# Patient Record
Sex: Male | Born: 1935 | Race: White | Hispanic: No | Smoking: Never smoker
Health system: Southern US, Community
[De-identification: ages and names within clinical notes are randomized; demographics above are authoritative.]

## PROBLEM LIST (undated history)

## (undated) DIAGNOSIS — I10 Essential (primary) hypertension: Secondary | ICD-10-CM

## (undated) DIAGNOSIS — F0390 Unspecified dementia without behavioral disturbance: Secondary | ICD-10-CM

## (undated) HISTORY — PX: HERNIA REPAIR: SHX51

---

## 2016-07-25 ENCOUNTER — Emergency Department (HOSPITAL_COMMUNITY): Payer: Medicare Other

## 2016-07-25 ENCOUNTER — Emergency Department (HOSPITAL_COMMUNITY)
Admission: EM | Admit: 2016-07-25 | Discharge: 2016-07-25 | Disposition: A | Discharge status: Home / Self Care | Payer: Medicare Other | Attending: Emergency Medicine | Admitting: Emergency Medicine

## 2016-07-25 DIAGNOSIS — W01198A Fall on same level from slipping, tripping and stumbling with subsequent striking against other object, initial encounter: Secondary | ICD-10-CM | POA: Insufficient documentation

## 2016-07-25 DIAGNOSIS — Y999 Unspecified external cause status: Secondary | ICD-10-CM | POA: Insufficient documentation

## 2016-07-25 DIAGNOSIS — S0990XA Unspecified injury of head, initial encounter: Secondary | ICD-10-CM | POA: Diagnosis present

## 2016-07-25 DIAGNOSIS — S0081XA Abrasion of other part of head, initial encounter: Secondary | ICD-10-CM

## 2016-07-25 DIAGNOSIS — S0181XA Laceration without foreign body of other part of head, initial encounter: Secondary | ICD-10-CM | POA: Diagnosis not present

## 2016-07-25 DIAGNOSIS — S72114A Nondisplaced fracture of greater trochanter of right femur, initial encounter for closed fracture: Secondary | ICD-10-CM | POA: Insufficient documentation

## 2016-07-25 DIAGNOSIS — W19XXXA Unspecified fall, initial encounter: Secondary | ICD-10-CM

## 2016-07-25 DIAGNOSIS — Y929 Unspecified place or not applicable: Secondary | ICD-10-CM | POA: Diagnosis not present

## 2016-07-25 DIAGNOSIS — Y9301 Activity, walking, marching and hiking: Secondary | ICD-10-CM | POA: Insufficient documentation

## 2016-07-25 MED ORDER — HYDROCODONE-ACETAMINOPHEN 5-325 MG PO TABS
1.0000 | ORAL_TABLET | Freq: Once | ORAL | Status: AC
  Administered 2016-07-25: 1 via ORAL
  Filled 2016-07-25: qty 1

## 2016-07-25 MED ORDER — HYDROCODONE-ACETAMINOPHEN 5-325 MG PO TABS: 1.0000 | ORAL_TABLET | Freq: Four times a day (QID) | ORAL | 0 refills | Status: DC | PRN

## 2016-07-25 NOTE — ED Triage Notes (Signed)
Patient's family member states patient fell this morning hitting his head on the right temple and is also complaining of right hip pain at triage. Denies LOC.

## 2016-07-25 NOTE — ED Provider Notes (Signed)
[facility] Provider Note   CSN: [id number] Arrival date & time: 07/25/16  1133  By signing my name below, I, Sonum Patel, attest that this documentation has been prepared under the direction and in the presence of Geoffery Lyonsouglas Khushboo Chuck, MD. Electronically Signed: Sonum Patel, Neurosurgeoncribe. 07/25/16. 1:08 PM.  History   Chief Complaint Chief Complaint  Patient presents with  . Fall   Majority of history provided by daughter who is at bedside The history is provided by a relative and the patient. No language interpreter was used.  Fall  This is a new problem. The current episode started 1 to 2 hours ago.    HPI Comments: Jonathan Valencia is a 83 y.o. male who presents to the Emergency Department complaining of an unwitnessed fall that occurred PTA. Patient's daughter states he was walking with clothes in his hands when he tripped and fell, landing on his right hip and struck his right temple. She denies LOC. She states he typically walks with a cane but had to be put in a wheelchair this morning. She attempted to take him to an adult care facility but the nurse there suggested he be evaluated in the ED due to his inability to walk and increased incoherence. Patient denies neck pain, numbness, hip pain, back pain, or any other symptoms at this time. Daughter denies anti-coagulant use.    No past medical history on file.  There are no active problems to display for this patient.   No past surgical history on file.     Home Medications    Prior to Admission medications   Not on File    Family History No family history on file.  Social History Social History  Substance Use Topics  . Smoking status: Not on file  . Smokeless tobacco: Not on file  . Alcohol use Not on file     Allergies   Patient has no allergy information on record.   Review of Systems Review of Systems  A complete 10 system review of systems was obtained and all systems are negative except as noted in the  HPI and PMH.    Physical Exam Updated Vital Signs BP 151/84 (BP Location: Left Arm)   Pulse (!) 56   Temp 97.6 F (36.4 C) (Oral)   Resp 18   Ht 5' (1.524 m)   Wt 135 lb (61.2 kg)   SpO2 100%   BMI 26.37 kg/m   Physical Exam  Constitutional: He appears well-developed and well-nourished.  HENT:  Head: Normocephalic.  Mild bruising to the right temple and a small 1 cm, round, skin tear.   Eyes: EOM are normal. Pupils are equal, round, and reactive to light.  Neck: Normal range of motion.  Cardiovascular: Normal rate, regular rhythm, normal heart sounds and intact distal pulses.   Pulmonary/Chest: Effort normal and breath sounds normal. No respiratory distress.  Abdominal: Soft. He exhibits no distension. There is no tenderness.  Musculoskeletal: Normal range of motion. He exhibits tenderness.  Mild tenderness over the lateral right hip. Full ROM with minimal discomfort. Distal extremity PMS intact. Pelvis stable.   Neurological: He is alert. No cranial nerve deficit. He exhibits normal muscle tone. Coordination normal.  Skin: Skin is warm and dry.  Psychiatric: He has a normal mood and affect. Judgment normal.  Nursing note and vitals reviewed.    ED Treatments / Results  DIAGNOSTIC STUDIES: Oxygen Saturation is 100% on RA, normal by my interpretation.    COORDINATION OF CARE: 12:32  PM Discussed treatment plan with pt at bedside and pt agreed to plan.    Labs (all labs ordered are listed, but only abnormal results are displayed) Labs Reviewed - No data to display  EKG  EKG Interpretation None       Radiology No results found.  Procedures Procedures (including critical care time)  Medications Ordered in ED Medications - No data to display   Initial Impression / Assessment and Plan / ED Course  I have reviewed the triage vital signs and the nursing notes.  Pertinent labs & imaging results that were available during my care of the patient were reviewed by  me and considered in my medical decision making (see chart for details).  Clinical Course     Patient presents after a fall at home. He has dementia and his history is somewhat limited. Most of the information was provided through the daughter who is present and at bedside. He has abrasions to the right temple which are not in need of repair. I will recommend bacitracin and local wound care. CT is negative, however x-ray of the right hip does reveal a fracture of the greater trochanter. I discussed these findings with Dr. Magnus Ivan from orthopedic surgery. He does not feel as though this warrants any surgical information. He will be treated as weightbearing as tolerated and given pain medication. To return as needed for any problems.  Final Clinical Impressions(s) / ED Diagnoses   Final diagnoses:  None    New Prescriptions New Prescriptions   No medications on file   I personally performed the services described in this documentation, which was scribed in my presence. The recorded information has been reviewed and is accurate.        Geoffery Lyons, MD 07/25/16 [phone]

## 2016-07-25 NOTE — Discharge Instructions (Signed)
Local wound care with bacitracin and dressing changes twice daily.  Weightbearing as tolerated to the right hip.  Hydrocodone as prescribed as needed for pain.  Follow-up with your primary Dr. in the next week, and return to the ER for worsening pain, increased confusion, severe headache, or other new and concerning symptoms.

## 2016-07-25 NOTE — ED Notes (Signed)
Family denies pt uses bld thinner.

## 2016-08-01 ENCOUNTER — Emergency Department (HOSPITAL_COMMUNITY): Payer: Medicare Other

## 2016-08-01 ENCOUNTER — Emergency Department (HOSPITAL_COMMUNITY)
Admission: EM | Admit: 2016-08-01 | Discharge: 2016-08-01 | Disposition: A | Discharge status: Home / Self Care | Payer: Medicare Other | Attending: Emergency Medicine | Admitting: Emergency Medicine

## 2016-08-01 ENCOUNTER — Encounter (HOSPITAL_COMMUNITY): Payer: Self-pay | Admitting: Emergency Medicine

## 2016-08-01 DIAGNOSIS — W19XXXA Unspecified fall, initial encounter: Secondary | ICD-10-CM

## 2016-08-01 DIAGNOSIS — S79911D Unspecified injury of right hip, subsequent encounter: Secondary | ICD-10-CM | POA: Diagnosis present

## 2016-08-01 DIAGNOSIS — S0990XD Unspecified injury of head, subsequent encounter: Secondary | ICD-10-CM | POA: Diagnosis not present

## 2016-08-01 DIAGNOSIS — W1839XD Other fall on same level, subsequent encounter: Secondary | ICD-10-CM | POA: Insufficient documentation

## 2016-08-01 DIAGNOSIS — S72114D Nondisplaced fracture of greater trochanter of right femur, subsequent encounter for closed fracture with routine healing: Secondary | ICD-10-CM | POA: Insufficient documentation

## 2016-08-01 DIAGNOSIS — Z7982 Long term (current) use of aspirin: Secondary | ICD-10-CM | POA: Diagnosis not present

## 2016-08-01 DIAGNOSIS — Z79899 Other long term (current) drug therapy: Secondary | ICD-10-CM | POA: Insufficient documentation

## 2016-08-01 DIAGNOSIS — S72001D Fracture of unspecified part of neck of right femur, subsequent encounter for closed fracture with routine healing: Secondary | ICD-10-CM

## 2016-08-01 DIAGNOSIS — I10 Essential (primary) hypertension: Secondary | ICD-10-CM | POA: Insufficient documentation

## 2016-08-01 DIAGNOSIS — M25551 Pain in right hip: Secondary | ICD-10-CM

## 2016-08-01 HISTORY — DX: Essential (primary) hypertension: I10

## 2016-08-01 HISTORY — DX: Unspecified dementia, unspecified severity, without behavioral disturbance, psychotic disturbance, mood disturbance, and anxiety: F03.90

## 2016-08-01 LAB — COMPREHENSIVE METABOLIC PANEL
ALT: 20 U/L (ref 17–63)
ANION GAP: 6 (ref 5–15)
AST: 35 U/L (ref 15–41)
Albumin: 3.4 g/dL — ABNORMAL LOW (ref 3.5–5.0)
Alkaline Phosphatase: 73 U/L (ref 38–126)
BUN: 14 mg/dL (ref 6–20)
CHLORIDE: 102 mmol/L (ref 101–111)
CO2: 30 mmol/L (ref 22–32)
Calcium: 9.2 mg/dL (ref 8.9–10.3)
Creatinine, Ser: 0.73 mg/dL (ref 0.61–1.24)
Glucose, Bld: 99 mg/dL (ref 65–99)
POTASSIUM: 4 mmol/L (ref 3.5–5.1)
SODIUM: 138 mmol/L (ref 135–145)
Total Bilirubin: 2.3 mg/dL — ABNORMAL HIGH (ref 0.3–1.2)
Total Protein: 6.3 g/dL — ABNORMAL LOW (ref 6.5–8.1)

## 2016-08-01 LAB — URINALYSIS, ROUTINE W REFLEX MICROSCOPIC
BILIRUBIN URINE: NEGATIVE
Glucose, UA: NEGATIVE mg/dL
Hgb urine dipstick: NEGATIVE
Ketones, ur: NEGATIVE mg/dL
LEUKOCYTES UA: NEGATIVE
NITRITE: NEGATIVE
Protein, ur: NEGATIVE mg/dL
SPECIFIC GRAVITY, URINE: 1.015 (ref 1.005–1.030)
pH: 6 (ref 5.0–8.0)

## 2016-08-01 LAB — CBC WITH DIFFERENTIAL/PLATELET
Basophils Absolute: 0 10*3/uL (ref 0.0–0.1)
Basophils Relative: 0 %
EOS ABS: 0 10*3/uL (ref 0.0–0.7)
EOS PCT: 0 %
HCT: 39.9 % (ref 39.0–52.0)
Hemoglobin: 13.6 g/dL (ref 13.0–17.0)
LYMPHS ABS: 1.4 10*3/uL (ref 0.7–4.0)
LYMPHS PCT: 13 %
MCH: 30.4 pg (ref 26.0–34.0)
MCHC: 34.1 g/dL (ref 30.0–36.0)
MCV: 89.3 fL (ref 78.0–100.0)
Monocytes Absolute: 0.9 10*3/uL (ref 0.1–1.0)
Monocytes Relative: 8 %
Neutro Abs: 8.9 10*3/uL — ABNORMAL HIGH (ref 1.7–7.7)
Neutrophils Relative %: 79 %
PLATELETS: 307 10*3/uL (ref 150–400)
RBC: 4.47 MIL/uL (ref 4.22–5.81)
RDW: 13.9 % (ref 11.5–15.5)
WBC: 11.3 10*3/uL — ABNORMAL HIGH (ref 4.0–10.5)

## 2016-08-01 NOTE — ED Notes (Signed)
2 person moderate assist to transfer from bed to w/c and w/c to car

## 2016-08-01 NOTE — ED Provider Notes (Signed)
[facility] Provider Note   CSN: [id number] Arrival date & time: 08/01/16  2130  By signing my name below, I, Freida Busman, attest that this documentation has been prepared under the direction and in the presence of Lavera Guise, MD . Electronically Signed: Freida Busman, Scribe. 08/01/2016. 9:27 AM.  History   Chief Complaint Chief Complaint  Patient presents with  . Fall    The history is provided by the patient and a relative. No language interpreter was used.    HPI Comments:  Jonathan Valencia is a 83 y.o. male who presents to the Emergency Department s/p fall x 2 last night. He is complaining of moderate pain to his right hip only with movement. Pt denies pain when at rest. Both falls were unwitnessed but daughter speculates he injured his right elbow and face as she noticed bruising. Daughter also believes he struck his hip on a table during the second fall.  Pt denies LOC. He states he landed on his right side. Pt was diagnosed with a fracture of the right greater trochanter on 07/25/16 following another fall. He had not been ambulatory since but yesterday he was attempting to walk when he fell.  He also denies SOB, cough, vomiting, and diarrhea  Pt lives at home with his daughter.    Past Medical History:  Diagnosis Date  . Dementia   . Hypertension     There are no active problems to display for this patient.   Past Surgical History:  Procedure Laterality Date  . HERNIA REPAIR       Home Medications    Prior to Admission medications   Medication Sig Start Date End Date Taking? Authorizing Provider  aspirin EC 81 MG tablet Take 81 mg by mouth daily.   Yes Historical Provider, MD  divalproex (DEPAKOTE SPRINKLE) 125 MG capsule Take 125 mg by mouth daily. Take at 4 pm.   Yes Historical Provider, MD  donepezil (ARICEPT) 10 MG tablet Take 10 mg by mouth daily.   Yes Historical Provider, MD  HYDROcodone-acetaminophen (NORCO) 5-325 MG tablet Take 1-2 tablets by  mouth every 6 (six) hours as needed. Patient taking differently: Take 1-2 tablets by mouth every 6 (six) hours as needed for moderate pain.  07/25/16  Yes Geoffery Lyons, MD  quinapril (ACCUPRIL) 40 MG tablet Take 40 mg by mouth daily.   Yes Historical Provider, MD  traZODone (DESYREL) 50 MG tablet Take 25 mg by mouth at bedtime.   Yes Historical Provider, MD    Family History History reviewed. No pertinent family history.  Social History Social History  Substance Use Topics  . Smoking status: Never Smoker  . Smokeless tobacco: Never Used  . Alcohol use No     Allergies   Patient has no known allergies.   Review of Systems Review of Systems  10 systems reviewed and all are negative for acute change except as noted in the HPI.   Physical Exam Updated Vital Signs BP 150/97 (BP Location: Left Arm)   Pulse 64   Temp 97.6 F (36.4 C) (Oral)   Resp 16   Ht 5' (1.524 m)   Wt 135 lb (61.2 kg)   SpO2 100%   BMI 26.37 kg/m   Physical Exam Physical Exam  Nursing note and vitals reviewed. Constitutional: Well developed, well nourished, non-toxic, and in no acute distress Head: Normocephalic and atraumatic.  Mouth/Throat: Oropharynx is clear and moist.  Neck: Normal range of motion. Neck supple.  Cardiovascular: Normal rate  and regular rhythm.   Pulmonary/Chest: Effort normal and breath sounds normal.  Abdominal: Soft. There is no tenderness. There is no rebound and no guarding.  Musculoskeletal: Normal range of motion of all 4 extremities. No CTLS spine tenderness. No chest wall tenderness  Neurological: Alert, no facial droop, fluent speech, moves all extremities symmetrically Skin: Skin is warm and dry.  Psychiatric: Cooperative   ED Treatments / Results  DIAGNOSTIC STUDIES:  Oxygen Saturation is 100% on RA, normal by my interpretation.    COORDINATION OF CARE:  9:24 AM Discussed treatment plan with pt and family at bedside and they agreed to plan.  Labs (all labs  ordered are listed, but only abnormal results are displayed) Labs Reviewed  CBC WITH DIFFERENTIAL/PLATELET - Abnormal; Notable for the following:       Result Value   WBC 11.3 (*)    Neutro Abs 8.9 (*)    All other components within normal limits  COMPREHENSIVE METABOLIC PANEL - Abnormal; Notable for the following:    Total Protein 6.3 (*)    Albumin 3.4 (*)    Total Bilirubin 2.3 (*)    All other components within normal limits  URINALYSIS, ROUTINE W REFLEX MICROSCOPIC - Abnormal; Notable for the following:    APPearance HAZY (*)    All other components within normal limits    EKG  EKG Interpretation None       Radiology Dg Shoulder Right  Result Date: 08/01/2016 CLINICAL DATA:  Right hip and right shoulder pain.  Fall. EXAM: RIGHT SHOULDER - 2+ VIEW COMPARISON:  No recent prior . FINDINGS: Acromioclavicular glenohumeral degenerative change. No evidence of fracture dislocation. Diffuse osteopenia . IMPRESSION: Diffuse osteopenia degenerative change. No acute abnormality identified. Electronically Signed   By: Maisie Fus  Register   On: 08/01/2016 10:04   Ct Head Wo Contrast  Result Date: 08/01/2016 CLINICAL DATA:  The patient fell twice last night.  Head trauma. EXAM: CT HEAD WITHOUT CONTRAST CT CERVICAL SPINE WITHOUT CONTRAST TECHNIQUE: Multidetector CT imaging of the head and cervical spine was performed following the standard protocol without intravenous contrast. Multiplanar CT image reconstructions of the cervical spine were also generated. COMPARISON:  None. CT scan dated 07/25/2016 FINDINGS: CT HEAD FINDINGS Brain: No evidence of acute infarction, hemorrhage, hydrocephalus, extra-axial collection or mass lesion/mass effect. Diffuse slight atrophy with secondary ventricular dilatation. Vascular: No hyperdense vessel or unexpected calcification. Skull: Normal. Negative for fracture or focal lesion. Sinuses/Orbits: Normal. CT CERVICAL SPINE FINDINGS Alignment: Slight anterolisthesis  at C3-4 and C7-T1 due to facet arthritis at each of those levels. Skull base and vertebrae: No acute fracture. Soft tissues and spinal canal: No prevertebral fluid or swelling. No visible canal hematoma. Disc levels: Disc space narrowing at C5-6 and C6-7 without focal disc protrusion. Severe bilateral facet arthritis at C3-4. Auto fusion of the left facet joint at C5-6. Severe degenerative changes between the odontoid in the anterior arch of C1. Upper chest: Negative. Other: None IMPRESSION: 1. No acute intracranial abnormality.  Diffuse mild atrophy. 2. No acute abnormality of the cervical spine. Multilevel degenerative disc and joint disease as described. Electronically Signed   By: Francene Boyers M.D.   On: 08/01/2016 10:40   Ct Cervical Spine Wo Contrast  Result Date: 08/01/2016 CLINICAL DATA:  The patient fell twice last night.  Head trauma. EXAM: CT HEAD WITHOUT CONTRAST CT CERVICAL SPINE WITHOUT CONTRAST TECHNIQUE: Multidetector CT imaging of the head and cervical spine was performed following the standard protocol without intravenous contrast. Multiplanar  CT image reconstructions of the cervical spine were also generated. COMPARISON:  None. CT scan dated 07/25/2016 FINDINGS: CT HEAD FINDINGS Brain: No evidence of acute infarction, hemorrhage, hydrocephalus, extra-axial collection or mass lesion/mass effect. Diffuse slight atrophy with secondary ventricular dilatation. Vascular: No hyperdense vessel or unexpected calcification. Skull: Normal. Negative for fracture or focal lesion. Sinuses/Orbits: Normal. CT CERVICAL SPINE FINDINGS Alignment: Slight anterolisthesis at C3-4 and C7-T1 due to facet arthritis at each of those levels. Skull base and vertebrae: No acute fracture. Soft tissues and spinal canal: No prevertebral fluid or swelling. No visible canal hematoma. Disc levels: Disc space narrowing at C5-6 and C6-7 without focal disc protrusion. Severe bilateral facet arthritis at C3-4. Auto fusion of the  left facet joint at C5-6. Severe degenerative changes between the odontoid in the anterior arch of C1. Upper chest: Negative. Other: None IMPRESSION: 1. No acute intracranial abnormality.  Diffuse mild atrophy. 2. No acute abnormality of the cervical spine. Multilevel degenerative disc and joint disease as described. Electronically Signed   By: Francene BoyersJames  Maxwell M.D.   On: 08/01/2016 10:40   Dg Hip Unilat W Or Wo Pelvis 2-3 Views Right  Result Date: 08/01/2016 CLINICAL DATA:  Right hip pain after fall.  Subsequent encounter EXAM: DG HIP (WITH OR WITHOUT PELVIS) 2-3V RIGHT COMPARISON:  07/25/2014 FINDINGS: Nondisplaced right greater trochanter fracture has a stable appearance since prior. No visible callus. No new or progressed fracture line. Both hips are located. Osteopenia and atherosclerosis. IMPRESSION: Unchanged appearance of recent right greater trochanter fracture that is nondisplaced. Electronically Signed   By: Marnee SpringJonathon  Watts M.D.   On: 08/01/2016 10:05    Procedures Procedures (including critical care time)  Medications Ordered in ED Medications - No data to display   Initial Impression / Assessment and Plan / ED Course  I have reviewed the triage vital signs and the nursing notes.  Pertinent labs & imaging results that were available during my care of the patient were reviewed by me and considered in my medical decision making (see chart for details).  Clinical Course    83 year old male who presents after mechanical fall due to gait instability. Records reviewed. Sustained tip of the greater trochanter fracture 1/8 which was discussed with Dr. Magnus IvanBlackman. non surgical and WBAT. Normal ROM of right hip at rest, but just pain with ambulation that is limiting him. Repeat X-ray stable. Ct head and cervical spine visualized and negative for traumatic injury. Xr shoulder w/o fracture. Blood work reassuring and UA normal.  Family expresses concern for need for placement in skilled nursing  facility. Not requiring admission today.   11:20 AM Pt updated with results. Discussed with SW. Recommending home health and home health SW will help patient get placed at skilled nursing facility.  Plan discussed with family. Strict return and follow-up instructions reviewed. They expressed understanding of all discharge instructions and felt comfortable with the plan of care.   Final Clinical Impressions(s) / ED Diagnoses   Final diagnoses:  Fall, initial encounter  Right hip pain  Closed fracture of right hip with routine healing, subsequent encounter    New Prescriptions New Prescriptions   No medications on file   I personally performed the services described in this documentation, which was scribed in my presence. The recorded information has been reviewed and is accurate.     Lavera Guiseana Duo Paulett Kaufhold, MD 08/01/16 1226

## 2016-08-01 NOTE — Care Management (Signed)
CM spoke with daughter over the phone. Patient lives with her, has dementia and does not meet criteria for placement and can not afford to pay out of pocket. Daughter would like to have Home health RN and SW to assist with possible placement from home. Offered choice of Home health agencies. Alroy BailiffLInda Lothian [facility] from [facility]. [facility], CM will request for patient to be evaluated sooner if possible. EDP to order home health orders and face to face evaluation.

## 2016-08-01 NOTE — ED Triage Notes (Signed)
Patient brought in by family member stating patient fell x 2 last night. Patient has bruising noted to right shoulder and elbow, left forearm, and abrasion to top of head.

## 2016-08-01 NOTE — ED Notes (Signed)
Spoke with case management, someone will come to ED to speak with pt and family about setting up home health.

## 2016-08-01 NOTE — Progress Notes (Signed)
Mr. Jonathan Valencia does NOT have a PCP at this time.  I spoke with his daughter, Jonathan Valencia, whom is in the process of finding her father a PCP.  She will contact [facility] when her father has a visit arranged for a PCP.  Alroy BailiffLinda Lothian RN

## 2016-08-01 NOTE — Clinical Social Work Note (Signed)
CSW spoke with patient's daughter regarding discharge planning. CSW explained that we are not able to place the patient under his Medicare due to having no qualifying stay. CSW explained that the daughter will have to make arrangements for the [facility] and possibly pursue placement from home with assistance of HHSW. RNCM assisting with home needs. CSW signing off at this time.    Roddie McBryant Djimon Lundstrom MSW, [place], [place], [phone]

## 2016-08-01 NOTE — Discharge Instructions (Signed)
Continue to give tylenol for hip pain and only weight bear as tolerated.  You are ordered home health and our visiting social worker will try to help place into a skilled nursing facility.   Return for worsening symptoms, including fever, confusion, recurrent injury, or any other symptoms concerning to you.  Work-up today is reassuring. No new injuries noted and hip fracture is stable.

## 2016-08-07 ENCOUNTER — Inpatient Hospital Stay (HOSPITAL_COMMUNITY)
Admission: EM | Admit: 2016-08-07 | Discharge: 2016-08-11 | DRG: 482 | Disposition: A | Discharge status: Home / Self Care | Payer: Medicare Other | Attending: Internal Medicine | Admitting: Internal Medicine

## 2016-08-07 ENCOUNTER — Emergency Department (HOSPITAL_COMMUNITY): Payer: Medicare Other

## 2016-08-07 ENCOUNTER — Encounter (HOSPITAL_COMMUNITY): Payer: Self-pay | Admitting: Emergency Medicine

## 2016-08-07 DIAGNOSIS — Z7982 Long term (current) use of aspirin: Secondary | ICD-10-CM

## 2016-08-07 DIAGNOSIS — Z79899 Other long term (current) drug therapy: Secondary | ICD-10-CM

## 2016-08-07 DIAGNOSIS — I959 Hypotension, unspecified: Secondary | ICD-10-CM | POA: Diagnosis present

## 2016-08-07 DIAGNOSIS — T148XXA Other injury of unspecified body region, initial encounter: Secondary | ICD-10-CM

## 2016-08-07 DIAGNOSIS — S72141A Displaced intertrochanteric fracture of right femur, initial encounter for closed fracture: Secondary | ICD-10-CM

## 2016-08-07 DIAGNOSIS — S72009A Fracture of unspecified part of neck of unspecified femur, initial encounter for closed fracture: Secondary | ICD-10-CM

## 2016-08-07 DIAGNOSIS — M25551 Pain in right hip: Secondary | ICD-10-CM | POA: Diagnosis not present

## 2016-08-07 DIAGNOSIS — W19XXXA Unspecified fall, initial encounter: Secondary | ICD-10-CM

## 2016-08-07 DIAGNOSIS — R2689 Other abnormalities of gait and mobility: Secondary | ICD-10-CM

## 2016-08-07 DIAGNOSIS — F0390 Unspecified dementia without behavioral disturbance: Secondary | ICD-10-CM | POA: Diagnosis present

## 2016-08-07 DIAGNOSIS — M6281 Muscle weakness (generalized): Secondary | ICD-10-CM

## 2016-08-07 DIAGNOSIS — R296 Repeated falls: Secondary | ICD-10-CM

## 2016-08-07 DIAGNOSIS — W1830XA Fall on same level, unspecified, initial encounter: Secondary | ICD-10-CM | POA: Diagnosis present

## 2016-08-07 DIAGNOSIS — S72111A Displaced fracture of greater trochanter of right femur, initial encounter for closed fracture: Secondary | ICD-10-CM

## 2016-08-07 DIAGNOSIS — I10 Essential (primary) hypertension: Secondary | ICD-10-CM | POA: Diagnosis present

## 2016-08-07 LAB — CBC WITH DIFFERENTIAL/PLATELET
BASOS ABS: 0 10*3/uL (ref 0.0–0.1)
BASOS PCT: 0 %
EOS PCT: 0 %
Eosinophils Absolute: 0 10*3/uL (ref 0.0–0.7)
HEMATOCRIT: 37.9 % — AB (ref 39.0–52.0)
Hemoglobin: 12.9 g/dL — ABNORMAL LOW (ref 13.0–17.0)
LYMPHS PCT: 8 %
Lymphs Abs: 1.2 10*3/uL (ref 0.7–4.0)
MCH: 30.2 pg (ref 26.0–34.0)
MCHC: 34 g/dL (ref 30.0–36.0)
MCV: 88.8 fL (ref 78.0–100.0)
Monocytes Absolute: 1.4 10*3/uL — ABNORMAL HIGH (ref 0.1–1.0)
Monocytes Relative: 9 %
NEUTROS ABS: 13.1 10*3/uL — AB (ref 1.7–7.7)
Neutrophils Relative %: 83 %
PLATELETS: 416 10*3/uL — AB (ref 150–400)
RBC: 4.27 MIL/uL (ref 4.22–5.81)
RDW: 13.5 % (ref 11.5–15.5)
WBC: 15.7 10*3/uL — AB (ref 4.0–10.5)

## 2016-08-07 LAB — URINALYSIS, ROUTINE W REFLEX MICROSCOPIC
Bilirubin Urine: NEGATIVE
GLUCOSE, UA: NEGATIVE mg/dL
Hgb urine dipstick: NEGATIVE
KETONES UR: NEGATIVE mg/dL
Leukocytes, UA: NEGATIVE
NITRITE: NEGATIVE
PROTEIN: NEGATIVE mg/dL
Specific Gravity, Urine: 1.014 (ref 1.005–1.030)
pH: 7 (ref 5.0–8.0)

## 2016-08-07 LAB — BASIC METABOLIC PANEL
ANION GAP: 12 (ref 5–15)
BUN: 22 mg/dL — ABNORMAL HIGH (ref 6–20)
CALCIUM: 9.4 mg/dL (ref 8.9–10.3)
CO2: 23 mmol/L (ref 22–32)
Chloride: 102 mmol/L (ref 101–111)
Creatinine, Ser: 1 mg/dL (ref 0.61–1.24)
Glucose, Bld: 191 mg/dL — ABNORMAL HIGH (ref 65–99)
POTASSIUM: 3.6 mmol/L (ref 3.5–5.1)
Sodium: 137 mmol/L (ref 135–145)

## 2016-08-07 LAB — VALPROIC ACID LEVEL

## 2016-08-07 LAB — LACTIC ACID, PLASMA: LACTIC ACID, VENOUS: 1.1 mmol/L (ref 0.5–1.9)

## 2016-08-07 MED ORDER — SODIUM CHLORIDE 0.9% FLUSH
3.0000 mL | Freq: Two times a day (BID) | INTRAVENOUS | Status: DC
  Administered 2016-08-07 – 2016-08-11 (×6): 3 mL via INTRAVENOUS

## 2016-08-07 MED ORDER — TRAZODONE HCL 50 MG PO TABS
25.0000 mg | ORAL_TABLET | Freq: Every day | ORAL | Status: DC
  Administered 2016-08-07 – 2016-08-10 (×4): 25 mg via ORAL
  Filled 2016-08-07 (×4): qty 1

## 2016-08-07 MED ORDER — HYDROCODONE-ACETAMINOPHEN 5-325 MG PO TABS
1.0000 | ORAL_TABLET | Freq: Four times a day (QID) | ORAL | Status: DC | PRN
  Administered 2016-08-08 – 2016-08-10 (×3): 1 via ORAL
  Filled 2016-08-07 (×3): qty 1

## 2016-08-07 MED ORDER — SODIUM CHLORIDE 0.9 % IV SOLN: 250.0000 mL | INTRAVENOUS | Status: DC | PRN

## 2016-08-07 MED ORDER — ONDANSETRON HCL 4 MG/2ML IJ SOLN
4.0000 mg | Freq: Four times a day (QID) | INTRAMUSCULAR | Status: DC | PRN
Start: 2016-08-07 — End: 2016-08-09

## 2016-08-07 MED ORDER — ACETAMINOPHEN 325 MG PO TABS
650.0000 mg | ORAL_TABLET | Freq: Once | ORAL | Status: AC
Start: 2016-08-07 — End: 2016-08-07
  Administered 2016-08-07: 650 mg via ORAL
  Filled 2016-08-07: qty 2

## 2016-08-07 MED ORDER — ALBUTEROL SULFATE (2.5 MG/3ML) 0.083% IN NEBU: 2.5000 mg | INHALATION_SOLUTION | RESPIRATORY_TRACT | Status: DC | PRN

## 2016-08-07 MED ORDER — ACETAMINOPHEN 325 MG PO TABS: 650.0000 mg | ORAL_TABLET | Freq: Four times a day (QID) | ORAL | Status: DC | PRN

## 2016-08-07 MED ORDER — DIVALPROEX SODIUM 125 MG PO CSDR
DELAYED_RELEASE_CAPSULE | ORAL | Status: AC
  Filled 2016-08-07: qty 1

## 2016-08-07 MED ORDER — SODIUM CHLORIDE 0.9% FLUSH: 3.0000 mL | INTRAVENOUS | Status: DC | PRN

## 2016-08-07 MED ORDER — DIVALPROEX SODIUM 125 MG PO CSDR
125.0000 mg | DELAYED_RELEASE_CAPSULE | Freq: Every day | ORAL | Status: DC
  Administered 2016-08-07 – 2016-08-10 (×3): 125 mg via ORAL
  Filled 2016-08-07 (×6): qty 1

## 2016-08-07 MED ORDER — ASPIRIN EC 81 MG PO TBEC
81.0000 mg | DELAYED_RELEASE_TABLET | Freq: Every day | ORAL | Status: DC
Start: 2016-08-08 — End: 2016-08-09
  Administered 2016-08-07 – 2016-08-09 (×3): 81 mg via ORAL
  Filled 2016-08-07 (×3): qty 1

## 2016-08-07 MED ORDER — SODIUM CHLORIDE 0.9 % IV BOLUS (SEPSIS)
1000.0000 mL | Freq: Once | INTRAVENOUS | Status: AC
  Administered 2016-08-07: 1000 mL via INTRAVENOUS

## 2016-08-07 MED ORDER — ONDANSETRON HCL 4 MG PO TABS: 4.0000 mg | ORAL_TABLET | Freq: Four times a day (QID) | ORAL | Status: DC | PRN

## 2016-08-07 MED ORDER — ACETAMINOPHEN 650 MG RE SUPP: 650.0000 mg | Freq: Four times a day (QID) | RECTAL | Status: DC | PRN

## 2016-08-07 MED ORDER — DONEPEZIL HCL 5 MG PO TABS
10.0000 mg | ORAL_TABLET | Freq: Every day | ORAL | Status: DC
  Administered 2016-08-07 – 2016-08-11 (×5): 10 mg via ORAL
  Filled 2016-08-07 (×5): qty 2

## 2016-08-07 NOTE — H&P (Signed)
[facility] History and Physical  Jonathan HugerLawrence Ariel NWG:[id number] DOB: 29-Dec-1926 DOA: 08/07/2016   PCP: Does not have PCP yet Specialists: None  Chief Complaint: Fall with pain in the right hip area  HPI: Jonathan Valencia is a 83 y.o. male with a past medical history dementia, hypertension, who lives with his daughter and moved to this area recently. Patient has had multiple falls over the last week or so. His daughter has been unable to care for him because he tends to wander in the nighttime due to his dementia. He was seen in the emergency department on 1/8 and then on 1/15. He was found to have a fracture involving the right greater trochanter. This was discussed with an orthopedic surgeon, Dr. Magnus IvanBlackman who felt that this was not a surgical fracture. Patient was supposed to get home health and daughter was supposed to pursue placement to SNF. However, this hasn't happened yet. Patient apparently fell twice in the last 24 hours. No history of any syncopal episode. No fever or chills at home. No nausea, vomiting, diarrhea. He denies any chest pain, shortness of breath at this time. Daughter denies any illness or sickness recently. No sick contacts recently except for the visit to the emergency department. Due to dementia history from the patient is extremely limited.  In the emergency department, patient was found to have worsening displacement of his fracture. EDP discussed with orthopedic surgeon. CT scan of the hip was recommended. Daughter is unable to care for him. He is found to have low-grade fever. He was found to have hypotension initially, which has responded to IV fluids.  Home Medications: Prior to Admission medications   Medication Sig Start Date End Date Taking? Authorizing Provider  aspirin EC 81 MG tablet Take 81 mg by mouth daily.   Yes Historical Provider, MD  divalproex (DEPAKOTE SPRINKLE) 125 MG capsule Take 125 mg by mouth daily. Take at 4 pm.   Yes Historical  Provider, MD  donepezil (ARICEPT) 10 MG tablet Take 10 mg by mouth daily.   Yes Historical Provider, MD  HYDROcodone-acetaminophen (NORCO) 5-325 MG tablet Take 1-2 tablets by mouth every 6 (six) hours as needed. Patient taking differently: Take 1-2 tablets by mouth every 6 (six) hours as needed for moderate pain.  07/25/16  Yes Geoffery Lyonsouglas Delo, MD  quinapril (ACCUPRIL) 40 MG tablet Take 40 mg by mouth daily.   Yes Historical Provider, MD  traZODone (DESYREL) 50 MG tablet Take 25 mg by mouth at bedtime.   Yes Historical Provider, MD    Allergies: No Known Allergies  Past Medical History: Past Medical History:  Diagnosis Date  . Dementia   . Hypertension     Past Surgical History:  Procedure Laterality Date  . HERNIA REPAIR      Social History: Lives with his daughter. No history of smoking, alcohol use or illicit drug use. Was using a cane at home.  Family History:  Unable to obtain from this patient due to his dementia  Review of Systems - unable to do due to his dementia  Physical Examination  Vitals:   08/07/16 1456 08/07/16 1519 08/07/16 1545 08/07/16 1630  BP: (!) 78/58 109/65  119/68  Pulse: 85 81 95 71  Resp: 17 19 20    Temp: 100.9 F (38.3 C)     TempSrc: Rectal     SpO2: 98% 97% 97% 97%  Weight: 61.2 kg (135 lb)     Height: 5' (1.524 m)       BP  119/68   Pulse 71   Temp 100.9 F (38.3 C) (Rectal)   Resp 20   Ht 5' (1.524 m)   Wt 61.2 kg (135 lb)   SpO2 97%   BMI 26.37 kg/m   General appearance: alert, cooperative, appears stated age and no distress Head: Normocephalic, without obvious abnormality, atraumatic Eyes: conjunctivae/corneas clear. PERRL, EOM's intact.  Throat: lips, mucosa, and tongue normal; teeth and gums normal Neck: no adenopathy, no carotid bruit, no JVD, supple, symmetrical, trachea midline and thyroid not enlarged, symmetric, no tenderness/mass/nodules Resp: clear to auscultation bilaterally Cardio: regular rate and rhythm, S1, S2  normal, no murmur, click, rub or gallop GI: soft, non-tender; bowel sounds normal; no masses,  no organomegaly Extremities: Able to move and lift his left leg without any difficulty. Limited range of motion of the right leg. Able to move his ankle. No swelling noted over his knee. Pain with movement of his right hip. Pulses: 2+ and symmetric Skin: Skin color, texture, turgor normal. No rashes or lesions Lymph nodes: Cervical, supraclavicular, and axillary nodes normal. Neurologic: Awake and alert. Disoriented. No obvious focal neurological deficits.   Labs on Admission: I have personally reviewed following labs and imaging studies  CBC:  Recent Labs Lab 08/01/16 1029 08/07/16 1517  WBC 11.3* 15.7*  NEUTROABS 8.9* 13.1*  HGB 13.6 12.9*  HCT 39.9 37.9*  MCV 89.3 88.8  PLT 307 416*   Basic Metabolic Panel:  Recent Labs Lab 08/01/16 1029 08/07/16 1517  NA 138 137  K 4.0 3.6  CL 102 102  CO2 30 23  GLUCOSE 99 191*  BUN 14 22*  CREATININE 0.73 1.00  CALCIUM 9.2 9.4   GFR: Estimated Creatinine Clearance: 40.1 mL/min (by C-G formula based on SCr of 1 mg/dL). Liver Function Tests:  Recent Labs Lab 08/01/16 1029  AST 35  ALT 20  ALKPHOS 73  BILITOT 2.3*  PROT 6.3*  ALBUMIN 3.4*   Urine analysis:    Component Value Date/Time   COLORURINE YELLOW 08/07/2016 1642   APPEARANCEUR CLEAR 08/07/2016 1642   LABSPEC 1.014 08/07/2016 1642   PHURINE 7.0 08/07/2016 1642   GLUCOSEU NEGATIVE 08/07/2016 1642   HGBUR NEGATIVE 08/07/2016 1642   BILIRUBINUR NEGATIVE 08/07/2016 1642   KETONESUR NEGATIVE 08/07/2016 1642   PROTEINUR NEGATIVE 08/07/2016 1642   NITRITE NEGATIVE 08/07/2016 1642   LEUKOCYTESUR NEGATIVE 08/07/2016 1642     Radiological Exams on Admission: Dg Chest 1 View  Result Date: 08/07/2016 CLINICAL DATA:  Initial evaluation for acute trauma, fall. EXAM: CHEST 1 VIEW COMPARISON:  None available. FINDINGS: Transverse heart size at the upper limits of normal.  Mediastinal silhouette within normal limits. Lungs mildly hypoinflated. Chronic coarsening of the interstitial markings present. No focal infiltrates. No pulmonary edema or pleural effusion. No pneumothorax. Calcified granuloma overlies the mid left lung. No acute osseous abnormality.  Bones are diffusely demineralized. IMPRESSION: Chronic coarsening of the interstitial markings with no active cardiopulmonary disease identified. Electronically Signed   By: Rise Mu M.D.   On: 08/07/2016 16:59   Dg Hip Unilat With Pelvis 2-3 Views Right  Result Date: 08/07/2016 CLINICAL DATA:  Initial evaluation for acute trauma, fall last week. Acute right hip pain. EXAM: DG HIP (WITH OR WITHOUT PELVIS) 2-3V RIGHT COMPARISON:  Prior radiograph from 08/01/2016. FINDINGS: Previously identified fracture involving the right greater trochanter again seen. There is new extension through the intertrochanteric right femur, with new 7-8 mm of displacement. Right femoral shaft is subluxed superiorly. Femoral head remains normally  aligned within the acetabulum. No definite acetabular fracture. Femoral head height is preserved. Remainder of the visualized bony pelvis is grossly intact. No acute injury about the left hip identified. Degenerative changes noted within the lower lumbar spine. Diffuse osteopenia noted. Soft tissue swelling overlies the right hip. IMPRESSION: Recently identified fracture involving the greater trochanter of the right femur now demonstrates intertrochanteric extension with new 7-8 mm of displacement. Electronically Signed   By: Rise Mu M.D.   On: 08/07/2016 16:58      Problem List  Principal Problem:   Fracture of greater trochanter of right femur (HCC) Active Problems:   Dementia   Falls frequently   Right hip pain   Assessment: This is a 83 year old Caucasian male with past medical history as stated earlier, who presents after multiple falls over the last week or so and 2  falls in the last 24 hours. He has right hip pain. He is found to have worsening displacement of his right greater trochanteric fracture. He was noted to have low-grade fever and hypotension with seems to have improved.  Plan: #1 Fracture of the right greater trochanter: There has been worsening displacement based on today's x-ray. CT scan is pending. ED physician has discussed with Dr. Romeo Apple with orthopedic surgeon will evaluate the patient tomorrow morning. Unclear if there is any role for surgery at this time. CT scan will shed more light on the situation. Pain control for now. PT and OT evaluation. Currently, bed rest until CT scan is done and patient has been seen by orthopedics.  #2 Low-grade fever with hypotension: Blood pressure responded quickly to IV fluids. Currently, patient is asymptomatic. No clear etiology for his fever. WBC is noted to be elevated. UA looks clear. Chest x-ray does not show any infiltrates. We will check influenza PCR. We'll check a lactic acid level. No clear indication to initiate antibiotics.  #3 history of dementia: Continue with Aricept. He also is noted to be on Depakote, which will be continued. Check Depakote level. Patient's daughter unable to care for him at home. May need to be placed in SNF.  #4 Questionable history of hypertension: He is noted to be on an ACE inhibitor, which will be held because he did have low blood pressure when he was initially evaluated. Monitor blood pressures closely  DVT Prophylaxis: SCDs for now Code Status: Discussed with his daughter. He is full code. Family Communication: Discussed with daughter  Disposition Plan: MedSurg Consults called: Orthopedics: Dr. Romeo Apple  Admission status: Patient's vital signs have improved and at this time, the fracture appears to be nonsurgical. Unclear if there is a clear indication for the patient to stay more than 2 midnights in the hospital. At this time he will be observation status. This  might change depending on CT scan report and orthopedic input.  Further management decisions will depend on results of further testing and patient's response to treatment.   [facility]  [facility] Pager [phone]  If 7PM-7AM, please contact night-coverage [email or web address] Password [facility]  08/07/2016, 6:54 PM

## 2016-08-07 NOTE — ED Triage Notes (Signed)
Jonathan Valencia last week and had injury to rt hip.  Jonathan Valencia again today and c/o rt hip pain.  Ems states no loc

## 2016-08-07 NOTE — ED Notes (Signed)
Pt removed IV.  Given po fluids, tolerating well. EDP notified

## 2016-08-07 NOTE — ED Provider Notes (Signed)
[facility]-EMERGENCY DEPT Provider Note   CSN: [id number] Arrival date & time: 08/07/16  1453     History   Chief Complaint Chief Complaint  Patient presents with  . Fall    HPI Jonathan Valencia is a 83 y.o. male.  Level V caveat for dementia. Patient is status post a comminuted fracture of the tip of the right greater trochanter on 07/25/16. He was sent home at that time. He has tried to get up and then typically has fallen. This happened again today and he complains of worsening right hip pain. His daughter is unable to take care of him at home. Review systems positive for low-grade fever.      Past Medical History:  Diagnosis Date  . Dementia   . Hypertension     There are no active problems to display for this patient.   Past Surgical History:  Procedure Laterality Date  . HERNIA REPAIR         Home Medications    Prior to Admission medications   Medication Sig Start Date End Date Taking? Authorizing Provider  aspirin EC 81 MG tablet Take 81 mg by mouth daily.   Yes Historical Provider, MD  divalproex (DEPAKOTE SPRINKLE) 125 MG capsule Take 125 mg by mouth daily. Take at 4 pm.   Yes Historical Provider, MD  donepezil (ARICEPT) 10 MG tablet Take 10 mg by mouth daily.   Yes Historical Provider, MD  HYDROcodone-acetaminophen (NORCO) 5-325 MG tablet Take 1-2 tablets by mouth every 6 (six) hours as needed. Patient taking differently: Take 1-2 tablets by mouth every 6 (six) hours as needed for moderate pain.  07/25/16  Yes Geoffery Lyonsouglas Delo, MD  quinapril (ACCUPRIL) 40 MG tablet Take 40 mg by mouth daily.   Yes Historical Provider, MD  traZODone (DESYREL) 50 MG tablet Take 25 mg by mouth at bedtime.   Yes Historical Provider, MD    Family History History reviewed. No pertinent family history.  Social History Social History  Substance Use Topics  . Smoking status: Never Smoker  . Smokeless tobacco: Never Used  . Alcohol use No     Allergies   Patient has no known  allergies.   Review of Systems Review of Systems  Reason unable to perform ROS: Dementia.     Physical Exam Updated Vital Signs BP 119/68   Pulse 71   Temp 100.9 F (38.3 C) (Rectal)   Resp 20   Ht 5' (1.524 m)   Wt 135 lb (61.2 kg)   SpO2 97%   BMI 26.37 kg/m   Physical Exam  Constitutional: He is oriented to person, place, and time.  Frail, pleasant, demented, low-grade fever  HENT:  Head: Normocephalic and atraumatic.  Eyes: Conjunctivae are normal.  Neck: Neck supple.  Cardiovascular: Normal rate and regular rhythm.   Pulmonary/Chest: Effort normal and breath sounds normal.  Abdominal: Soft. Bowel sounds are normal.  Musculoskeletal:  Tender right lateral hip. Pain with range of motion  Neurological: He is alert and oriented to person, place, and time.  Skin: Skin is warm and dry.  Psychiatric: He has a normal mood and affect. His behavior is normal.  Nursing note and vitals reviewed.    ED Treatments / Results  Labs (all labs ordered are listed, but only abnormal results are displayed) Labs Reviewed  CBC WITH DIFFERENTIAL/PLATELET - Abnormal; Notable for the following:       Result Value   WBC 15.7 (*)    Hemoglobin 12.9 (*)  HCT 37.9 (*)    Platelets 416 (*)    Neutro Abs 13.1 (*)    Monocytes Absolute 1.4 (*)    All other components within normal limits  BASIC METABOLIC PANEL - Abnormal; Notable for the following:    Glucose, Bld 191 (*)    BUN 22 (*)    All other components within normal limits  URINALYSIS, ROUTINE W REFLEX MICROSCOPIC    EKG  EKG Interpretation None       Radiology Dg Chest 1 View  Result Date: 08/07/2016 CLINICAL DATA:  Initial evaluation for acute trauma, fall. EXAM: CHEST 1 VIEW COMPARISON:  None available. FINDINGS: Transverse heart size at the upper limits of normal. Mediastinal silhouette within normal limits. Lungs mildly hypoinflated. Chronic coarsening of the interstitial markings present. No focal  infiltrates. No pulmonary edema or pleural effusion. No pneumothorax. Calcified granuloma overlies the mid left lung. No acute osseous abnormality.  Bones are diffusely demineralized. IMPRESSION: Chronic coarsening of the interstitial markings with no active cardiopulmonary disease identified. Electronically Signed   By: Rise Mu M.D.   On: 08/07/2016 16:59   Dg Hip Unilat With Pelvis 2-3 Views Right  Result Date: 08/07/2016 CLINICAL DATA:  Initial evaluation for acute trauma, fall last week. Acute right hip pain. EXAM: DG HIP (WITH OR WITHOUT PELVIS) 2-3V RIGHT COMPARISON:  Prior radiograph from 08/01/2016. FINDINGS: Previously identified fracture involving the right greater trochanter again seen. There is new extension through the intertrochanteric right femur, with new 7-8 mm of displacement. Right femoral shaft is subluxed superiorly. Femoral head remains normally aligned within the acetabulum. No definite acetabular fracture. Femoral head height is preserved. Remainder of the visualized bony pelvis is grossly intact. No acute injury about the left hip identified. Degenerative changes noted within the lower lumbar spine. Diffuse osteopenia noted. Soft tissue swelling overlies the right hip. IMPRESSION: Recently identified fracture involving the greater trochanter of the right femur now demonstrates intertrochanteric extension with new 7-8 mm of displacement. Electronically Signed   By: Rise Mu M.D.   On: 08/07/2016 16:58    Procedures Procedures (including critical care time)  Medications Ordered in ED Medications  sodium chloride 0.9 % bolus 1,000 mL (0 mLs Intravenous Stopped 08/07/16 1555)  acetaminophen (TYLENOL) tablet 650 mg (650 mg Oral Given 08/07/16 1552)     Initial Impression / Assessment and Plan / ED Course  I have reviewed the triage vital signs and the nursing notes.  Pertinent labs & imaging results that were available during my care of the patient were  reviewed by me and considered in my medical decision making (see chart for details).   Plain films of the right hip show a recently identified fracture of the greater trochanter of the right femur with an intertrochanteric extension with 7-8 mm of displacement.  Discussed with Dr. Romeo Apple. Admit to general medicine. Discussed with daughter Jonathan Valencia [phone]  Final Clinical Impressions(s) / ED Diagnoses   Final diagnoses:  Closed intertrochanteric fracture, right, initial encounter [facility])    New Prescriptions New Prescriptions   No medications on file     Donnetta Hutching, MD 08/07/16 1827

## 2016-08-08 DIAGNOSIS — I10 Essential (primary) hypertension: Secondary | ICD-10-CM | POA: Diagnosis present

## 2016-08-08 DIAGNOSIS — M25551 Pain in right hip: Secondary | ICD-10-CM | POA: Diagnosis not present

## 2016-08-08 DIAGNOSIS — R296 Repeated falls: Secondary | ICD-10-CM

## 2016-08-08 DIAGNOSIS — F0390 Unspecified dementia without behavioral disturbance: Secondary | ICD-10-CM | POA: Diagnosis present

## 2016-08-08 DIAGNOSIS — Z79899 Other long term (current) drug therapy: Secondary | ICD-10-CM | POA: Diagnosis not present

## 2016-08-08 DIAGNOSIS — Z7982 Long term (current) use of aspirin: Secondary | ICD-10-CM | POA: Diagnosis not present

## 2016-08-08 DIAGNOSIS — S72141A Displaced intertrochanteric fracture of right femur, initial encounter for closed fracture: Secondary | ICD-10-CM

## 2016-08-08 DIAGNOSIS — W1830XA Fall on same level, unspecified, initial encounter: Secondary | ICD-10-CM | POA: Diagnosis present

## 2016-08-08 DIAGNOSIS — I959 Hypotension, unspecified: Secondary | ICD-10-CM | POA: Diagnosis present

## 2016-08-08 DIAGNOSIS — S72111A Displaced fracture of greater trochanter of right femur, initial encounter for closed fracture: Secondary | ICD-10-CM | POA: Diagnosis present

## 2016-08-08 DIAGNOSIS — S72001D Fracture of unspecified part of neck of right femur, subsequent encounter for closed fracture with routine healing: Secondary | ICD-10-CM | POA: Diagnosis not present

## 2016-08-08 DIAGNOSIS — S72009A Fracture of unspecified part of neck of unspecified femur, initial encounter for closed fracture: Secondary | ICD-10-CM | POA: Diagnosis present

## 2016-08-08 DIAGNOSIS — F0150 Vascular dementia without behavioral disturbance: Secondary | ICD-10-CM | POA: Diagnosis not present

## 2016-08-08 LAB — PREPARE RBC (CROSSMATCH)

## 2016-08-08 LAB — COMPREHENSIVE METABOLIC PANEL
ALT: 19 U/L (ref 17–63)
AST: 30 U/L (ref 15–41)
Albumin: 3.4 g/dL — ABNORMAL LOW (ref 3.5–5.0)
Alkaline Phosphatase: 107 U/L (ref 38–126)
Anion gap: 10 (ref 5–15)
BUN: 14 mg/dL (ref 6–20)
CHLORIDE: 100 mmol/L — AB (ref 101–111)
CO2: 27 mmol/L (ref 22–32)
CREATININE: 0.66 mg/dL (ref 0.61–1.24)
Calcium: 9.3 mg/dL (ref 8.9–10.3)
GFR calc non Af Amer: >60 mL/min (ref >60)
Glucose, Bld: 137 mg/dL — ABNORMAL HIGH (ref 65–99)
Potassium: 3.6 mmol/L (ref 3.5–5.1)
Sodium: 137 mmol/L (ref 135–145)
Total Bilirubin: 2.5 mg/dL — ABNORMAL HIGH (ref 0.3–1.2)
Total Protein: 6.5 g/dL (ref 6.5–8.1)

## 2016-08-08 LAB — CBC
HEMATOCRIT: 40.7 % (ref 39.0–52.0)
HEMOGLOBIN: 13.7 g/dL (ref 13.0–17.0)
MCH: 29.9 pg (ref 26.0–34.0)
MCHC: 33.7 g/dL (ref 30.0–36.0)
MCV: 88.9 fL (ref 78.0–100.0)
Platelets: 336 10*3/uL (ref 150–400)
RBC: 4.58 MIL/uL (ref 4.22–5.81)
RDW: 13.5 % (ref 11.5–15.5)
WBC: 14.2 10*3/uL — ABNORMAL HIGH (ref 4.0–10.5)

## 2016-08-08 LAB — INFLUENZA PANEL BY PCR (TYPE A & B)
INFLBPCR: NEGATIVE
Influenza A By PCR: NEGATIVE

## 2016-08-08 LAB — SURGICAL PCR SCREEN
MRSA, PCR: NEGATIVE
STAPHYLOCOCCUS AUREUS: NEGATIVE

## 2016-08-08 LAB — ABO/RH: ABO/RH(D): AB POS

## 2016-08-08 MED ORDER — CHLORHEXIDINE GLUCONATE 4 % EX LIQD
60.0000 mL | Freq: Once | CUTANEOUS | Status: AC
  Administered 2016-08-08: 4 via TOPICAL
  Filled 2016-08-08: qty 60

## 2016-08-08 MED ORDER — CEFAZOLIN SODIUM-DEXTROSE 2-4 GM/100ML-% IV SOLN
2.0000 g | INTRAVENOUS | Status: DC
  Filled 2016-08-08: qty 100

## 2016-08-08 MED ORDER — POVIDONE-IODINE 10 % EX SWAB: 2.0000 "application " | Freq: Once | CUTANEOUS | Status: DC

## 2016-08-08 MED ORDER — CHLORHEXIDINE GLUCONATE 4 % EX LIQD: 60.0000 mL | Freq: Once | CUTANEOUS | Status: DC

## 2016-08-08 MED ORDER — CEFAZOLIN SODIUM-DEXTROSE 2-4 GM/100ML-% IV SOLN
2.0000 g | INTRAVENOUS | Status: AC
Start: 2016-08-09 — End: 2016-08-09
  Administered 2016-08-09: 2 g via INTRAVENOUS
  Filled 2016-08-08 (×2): qty 100

## 2016-08-08 MED ORDER — SODIUM CHLORIDE 0.9 % IV SOLN: Freq: Once | INTRAVENOUS | Status: DC

## 2016-08-08 NOTE — Progress Notes (Signed)
Patient's medications in grey plastic bag, counted and taken to pharmacy with patient's name on it.

## 2016-08-08 NOTE — Progress Notes (Signed)
PROGRESS NOTE    Jonathan Valencia  NUU:[id number] DOB: October 16, 1926 DOA: 08/07/2016 PCP: PROVIDER NOT IN SYSTEM    Brief Narrative: 83 yo with mechanical fall, hx of dementia, lives at home with daughter, admitted after fall with right hip fracture.  Dr Romeo Apple of orthopedics had seen him and planned ORIF tomorrow.  He has no complaints.  He was able to tell that he fell and hurt his hip.  He actually had fallen several times, and had known hip Fx, but Dr Magnus Ivan at that time did not feel he required surgical correction.  Follow up imaging after his recent fall showed more displacement.  Family also felt that he can no longer be adequately cared for at home.   Assessment & Plan:   Principal Problem:   Fracture of greater trochanter of right femur (HCC) Active Problems:   Dementia   Falls frequently   Right hip pain   1. Hip Fx:  NPO after midnight anticipating ORIF Tues.  Continue with IVF and pain medication. 2. HTN:  BP is controlled.  WIll continue to follow. 3. Dementia:  He has situational awareness, and able to converse meaningfully. 4    Placement:  Consult CM for placement after ORIF.   DVT prophylaxis: SCD.  Add ASA.  Code Status: FULL CODE.  Family Communication: Daughter is aware.  Disposition Plan: SNF   Consultants:   Orthopedics:  Dr Romeo Apple.   Procedures:   None.   Antimicrobials: Anti-infectives    Start     Dose/Rate Route Frequency Ordered Stop   08/09/16 0900  ceFAZolin (ANCEF) IVPB 2g/100 mL premix     2 g 200 mL/hr over 30 Minutes Intravenous On call to O.R. 08/08/16 3474 08/10/16 0559   08/08/16 0900  ceFAZolin (ANCEF) IVPB 2g/100 mL premix  Status:  Discontinued     2 g 200 mL/hr over 30 Minutes Intravenous On call to O.R. 08/08/16 2595 08/08/16 0859       Subjective:  No complaints. Pain is controlled.    Objective: Vitals:   08/07/16 1800 08/07/16 2143 08/08/16 0654 08/08/16 1405  BP: 134/80 115/79 (!) 146/71 134/90  Pulse:  72 (!)  55 76  Resp:  18 16 20   Temp:  98.3 F (36.8 C) 97.6 F (36.4 C) 97.9 F (36.6 C)  TempSrc:  Oral Oral Oral  SpO2:  98% 98% 99%  Weight:  49.5 kg (109 lb 3.2 oz)    Height:        Intake/Output Summary (Last 24 hours) at 08/08/16 1432 Last data filed at 08/08/16 1300  Gross per 24 hour  Intake              363 ml  Output              550 ml  Net             -187 ml   Filed Weights   08/07/16 1456 08/07/16 2143  Weight: 61.2 kg (135 lb) 49.5 kg (109 lb 3.2 oz)    Examination:  General exam: Appears calm and comfortable  Respiratory system: Clear to auscultation. Respiratory effort normal. Cardiovascular system: S1 & S2 heard, RRR. No JVD, murmurs, rubs, gallops or clicks. No pedal edema. Gastrointestinal system: Abdomen is nondistended, soft and nontender. No organomegaly or masses felt. Normal bowel sounds heard. Central nervous system: Alert and oriented. No focal neurological deficits. Extremities: Symmetric 5 x 5 power. Skin: No rashes, lesions or ulcers Psychiatry: Judgement and insight appear  normal. Mood & affect appropriate.   Data Reviewed: I have personally reviewed following labs and imaging studies  CBC:  Recent Labs Lab 08/07/16 1517 08/08/16 0619  WBC 15.7* 14.2*  NEUTROABS 13.1*  --   HGB 12.9* 13.7  HCT 37.9* 40.7  MCV 88.8 88.9  PLT 416* 336   Basic Metabolic Panel:  Recent Labs Lab 08/07/16 1517 08/08/16 0619  NA 137 137  K 3.6 3.6  CL 102 100*  CO2 23 27  GLUCOSE 191* 137*  BUN 22* 14  CREATININE 1.00 0.66  CALCIUM 9.4 9.3   GFR: Estimated Creatinine Clearance: 45.5 mL/min (by C-G formula based on SCr of 0.66 mg/dL). Liver Function Tests:  Recent Labs Lab 08/08/16 0619  AST 30  ALT 19  ALKPHOS 107  BILITOT 2.5*  PROT 6.5  ALBUMIN 3.4*   Sepsis Labs:  Recent Labs Lab 08/07/16 1913  LATICACIDVEN 1.1    Recent Results (from the past 240 hour(s))  Surgical pcr screen     Status: None   Collection Time: 08/08/16   8:51 AM  Result Value Ref Range Status   MRSA, PCR NEGATIVE NEGATIVE Final   Staphylococcus aureus NEGATIVE NEGATIVE Final    Comment:        The Xpert SA Assay (FDA approved for NASAL specimens in patients over 81 years of age), is one component of a comprehensive surveillance program.  Test performance has been validated by [facility] for patients greater than or equal to 81 year old. It is not intended to diagnose infection nor to guide or monitor treatment.      Radiology Studies: Dg Chest 1 View  Result Date: 08/07/2016 CLINICAL DATA:  Initial evaluation for acute trauma, fall. EXAM: CHEST 1 VIEW COMPARISON:  None available. FINDINGS: Transverse heart size at the upper limits of normal. Mediastinal silhouette within normal limits. Lungs mildly hypoinflated. Chronic coarsening of the interstitial markings present. No focal infiltrates. No pulmonary edema or pleural effusion. No pneumothorax. Calcified granuloma overlies the mid left lung. No acute osseous abnormality.  Bones are diffusely demineralized. IMPRESSION: Chronic coarsening of the interstitial markings with no active cardiopulmonary disease identified. Electronically Signed   By: Rise MuBenjamin  McClintock M.D.   On: 08/07/2016 16:59   Ct Hip Right Wo Contrast  Result Date: 08/07/2016 CLINICAL DATA:  Right hip fracture EXAM: CT OF THE RIGHT HIP WITHOUT CONTRAST TECHNIQUE: Multidetector CT imaging of the right hip was performed according to the standard protocol. Multiplanar CT image reconstructions were also generated. COMPARISON:  08/07/2016 and 08/01/2016 radiographs of the right hip FINDINGS: Bones/Joint/Cartilage Comminuted varus angulated intratrochanteric fracture of the right femur is noted with fracture undermining the greater trochanter as well. Joint space narrowing with subchondral degenerate cystic lucencies of the acetabular roof superolaterally. No right-sided pubic rami fracture nor right acetabular fracture noted.  Joint space narrowing along the weight-bearing portion of the right hip consistent with cartilaginous thinning and osteoarthritis. Small to moderate posttraumatic joint effusion. Ligaments Not well visualized by CT. Muscles and Tendons No intramuscular hemorrhage.  No focal soft tissue mass. Soft tissues Periarticular soft tissue swelling and edema secondary to fracture. IMPRESSION: Acute, closed, comminuted varus angulated intertrochanteric fracture of the right femur. Fracture undermining the greater trochanter as well. Small to moderate joint effusion. No acetabular fracture. No malalignment of the hip joint. Electronically Signed   By: Tollie Ethavid  Kwon M.D.   On: 08/07/2016 19:41   Dg Hip Unilat With Pelvis 2-3 Views Right  Result Date: 08/07/2016 CLINICAL DATA:  Initial evaluation for acute trauma, fall last week. Acute right hip pain. EXAM: DG HIP (WITH OR WITHOUT PELVIS) 2-3V RIGHT COMPARISON:  Prior radiograph from 08/01/2016. FINDINGS: Previously identified fracture involving the right greater trochanter again seen. There is new extension through the intertrochanteric right femur, with new 7-8 mm of displacement. Right femoral shaft is subluxed superiorly. Femoral head remains normally aligned within the acetabulum. No definite acetabular fracture. Femoral head height is preserved. Remainder of the visualized bony pelvis is grossly intact. No acute injury about the left hip identified. Degenerative changes noted within the lower lumbar spine. Diffuse osteopenia noted. Soft tissue swelling overlies the right hip. IMPRESSION: Recently identified fracture involving the greater trochanter of the right femur now demonstrates intertrochanteric extension with new 7-8 mm of displacement. Electronically Signed   By: Rise Mu M.D.   On: 08/07/2016 16:58    Scheduled Meds: . sodium chloride   Intravenous Once  . aspirin EC  81 mg Oral Daily  . [START ON 08/09/2016]  ceFAZolin (ANCEF) IV  2 g  Intravenous On Call to OR  . chlorhexidine  60 mL Topical Once  . divalproex  125 mg Oral Q1500  . donepezil  10 mg Oral Daily  . [START ON 08/09/2016] povidone-iodine  2 application Topical Once  . sodium chloride flush  3 mL Intravenous Q12H  . traZODone  25 mg Oral QHS   Continuous Infusions:   LOS: 1 day   Jonathan Colt, MD FACP Hospitalist.   If 7PM-7AM, please contact night-coverage [email or web address] Password [facility] 08/08/2016, 2:32 PM

## 2016-08-08 NOTE — Consult Note (Signed)
Reason for Consult: Right hip fracture Referring Physician: Dr. Orvan Falconer  [name] is an 83 y.o. male.  HPI: 83 year old male injured his right hip about a month ago had x-ray was diagnosed with a greater trochanteric hip fracture. He was sent home. He fell again on the 21st in his right hip again at this time could not walk is brought to the emergency room x-rays show an intertrochanteric fracture of the right hip.  He complains of severe L aching throbbing right hip pain which is nonradiating  Past Medical History:  Diagnosis Date  . Dementia   . Hypertension     Past Surgical History:  Procedure Laterality Date  . HERNIA REPAIR      History reviewed. No pertinent family history.  Social History:  reports that he has never smoked. He has never used smokeless tobacco. He reports that he does not drink alcohol or use drugs.  Allergies: No Known Allergies  Medications: I have reviewed the patient's current medications.  Results for orders placed or performed during the hospital encounter of 08/07/16 (from the past 48 hour(s))  CBC with Differential     Status: Abnormal   Collection Time: 08/07/16  3:17 PM  Result Value Ref Range   WBC 15.7 (H) 4.0 - 10.5 K/uL   RBC 4.27 4.22 - 5.81 MIL/uL   Hemoglobin 12.9 (L) 13.0 - 17.0 g/dL   HCT 37.9 (L) 39.0 - 52.0 %   MCV 88.8 78.0 - 100.0 fL   MCH 30.2 26.0 - 34.0 pg   MCHC 34.0 30.0 - 36.0 g/dL   RDW 13.5 11.5 - 15.5 %   Platelets 416 (H) 150 - 400 K/uL   Neutrophils Relative % 83 %   Neutro Abs 13.1 (H) 1.7 - 7.7 K/uL   Lymphocytes Relative 8 %   Lymphs Abs 1.2 0.7 - 4.0 K/uL   Monocytes Relative 9 %   Monocytes Absolute 1.4 (H) 0.1 - 1.0 K/uL   Eosinophils Relative 0 %   Eosinophils Absolute 0.0 0.0 - 0.7 K/uL   Basophils Relative 0 %   Basophils Absolute 0.0 0.0 - 0.1 K/uL  Basic metabolic panel     Status: Abnormal   Collection Time: 08/07/16  3:17 PM  Result Value Ref Range   Sodium 137 135 - 145 mmol/L    Potassium 3.6 3.5 - 5.1 mmol/L   Chloride 102 101 - 111 mmol/L   CO2 23 22 - 32 mmol/L   Glucose, Bld 191 (H) 65 - 99 mg/dL   BUN 22 (H) 6 - 20 mg/dL   Creatinine, Ser 1.00 0.61 - 1.24 mg/dL   Calcium 9.4 8.9 - 10.3 mg/dL   GFR calc non Af Amer >60 >60 mL/min   GFR calc Af Amer >60 >60 mL/min    Comment: (NOTE) The eGFR has been calculated using the CKD EPI equation. This calculation has not been validated in all clinical situations. eGFR's persistently <60 mL/min signify possible Chronic Kidney Disease.    Anion gap 12 5 - 15  Valproic acid level     Status: Abnormal   Collection Time: 08/07/16  3:17 PM  Result Value Ref Range   Valproic Acid Lvl <10 (L) 50.0 - 100.0 ug/mL  Urinalysis, Routine w reflex microscopic     Status: None   Collection Time: 08/07/16  4:42 PM  Result Value Ref Range   Color, Urine YELLOW YELLOW   APPearance CLEAR CLEAR   Specific Gravity, Urine 1.014 1.005 - 1.030  pH 7.0 5.0 - 8.0   Glucose, UA NEGATIVE NEGATIVE mg/dL   Hgb urine dipstick NEGATIVE NEGATIVE   Bilirubin Urine NEGATIVE NEGATIVE   Ketones, ur NEGATIVE NEGATIVE mg/dL   Protein, ur NEGATIVE NEGATIVE mg/dL   Nitrite NEGATIVE NEGATIVE   Leukocytes, UA NEGATIVE NEGATIVE  Lactic acid, plasma     Status: None   Collection Time: 08/07/16  7:13 PM  Result Value Ref Range   Lactic Acid, Venous 1.1 0.5 - 1.9 mmol/L  Influenza panel by PCR (type A & B)     Status: None   Collection Time: 08/08/16 12:26 AM  Result Value Ref Range   Influenza A By PCR NEGATIVE NEGATIVE   Influenza B By PCR NEGATIVE NEGATIVE    Comment: (NOTE) The Xpert Xpress Flu assay is intended as an aid in the diagnosis of  influenza and should not be used as a sole basis for treatment.  This  assay is FDA approved for nasopharyngeal swab specimens only. Nasal  washings and aspirates are unacceptable for Xpert Xpress Flu testing.   Comprehensive metabolic panel     Status: Abnormal   Collection Time: 08/08/16  6:19 AM   Result Value Ref Range   Sodium 137 135 - 145 mmol/L   Potassium 3.6 3.5 - 5.1 mmol/L   Chloride 100 (L) 101 - 111 mmol/L   CO2 27 22 - 32 mmol/L   Glucose, Bld 137 (H) 65 - 99 mg/dL   BUN 14 6 - 20 mg/dL   Creatinine, Ser 0.66 0.61 - 1.24 mg/dL   Calcium 9.3 8.9 - 10.3 mg/dL   Total Protein 6.5 6.5 - 8.1 g/dL   Albumin 3.4 (L) 3.5 - 5.0 g/dL   AST 30 15 - 41 U/L   ALT 19 17 - 63 U/L   Alkaline Phosphatase 107 38 - 126 U/L   Total Bilirubin 2.5 (H) 0.3 - 1.2 mg/dL   GFR calc non Af Amer >60 >60 mL/min   GFR calc Af Amer >60 >60 mL/min    Comment: (NOTE) The eGFR has been calculated using the CKD EPI equation. This calculation has not been validated in all clinical situations. eGFR's persistently <60 mL/min signify possible Chronic Kidney Disease.    Anion gap 10 5 - 15  CBC     Status: Abnormal   Collection Time: 08/08/16  6:19 AM  Result Value Ref Range   WBC 14.2 (H) 4.0 - 10.5 K/uL   RBC 4.58 4.22 - 5.81 MIL/uL   Hemoglobin 13.7 13.0 - 17.0 g/dL   HCT 40.7 39.0 - 52.0 %   MCV 88.9 78.0 - 100.0 fL   MCH 29.9 26.0 - 34.0 pg   MCHC 33.7 30.0 - 36.0 g/dL   RDW 13.5 11.5 - 15.5 %   Platelets 336 150 - 400 K/uL    Dg Chest 1 View  Result Date: 08/07/2016 CLINICAL DATA:  Initial evaluation for acute trauma, fall. EXAM: CHEST 1 VIEW COMPARISON:  None available. FINDINGS: Transverse heart size at the upper limits of normal. Mediastinal silhouette within normal limits. Lungs mildly hypoinflated. Chronic coarsening of the interstitial markings present. No focal infiltrates. No pulmonary edema or pleural effusion. No pneumothorax. Calcified granuloma overlies the mid left lung. No acute osseous abnormality.  Bones are diffusely demineralized. IMPRESSION: Chronic coarsening of the interstitial markings with no active cardiopulmonary disease identified. Electronically Signed   By: Jeannine Boga M.D.   On: 08/07/2016 16:59   Ct Hip Right Wo Contrast  Result  Date:  08/07/2016 CLINICAL DATA:  Right hip fracture EXAM: CT OF THE RIGHT HIP WITHOUT CONTRAST TECHNIQUE: Multidetector CT imaging of the right hip was performed according to the standard protocol. Multiplanar CT image reconstructions were also generated. COMPARISON:  08/07/2016 and 08/01/2016 radiographs of the right hip FINDINGS: Bones/Joint/Cartilage Comminuted varus angulated intratrochanteric fracture of the right femur is noted with fracture undermining the greater trochanter as well. Joint space narrowing with subchondral degenerate cystic lucencies of the acetabular roof superolaterally. No right-sided pubic rami fracture nor right acetabular fracture noted. Joint space narrowing along the weight-bearing portion of the right hip consistent with cartilaginous thinning and osteoarthritis. Small to moderate posttraumatic joint effusion. Ligaments Not well visualized by CT. Muscles and Tendons No intramuscular hemorrhage.  No focal soft tissue mass. Soft tissues Periarticular soft tissue swelling and edema secondary to fracture. IMPRESSION: Acute, closed, comminuted varus angulated intertrochanteric fracture of the right femur. Fracture undermining the greater trochanter as well. Small to moderate joint effusion. No acetabular fracture. No malalignment of the hip joint. Electronically Signed   By: Ashley Royalty M.D.   On: 08/07/2016 19:41   Dg Hip Unilat With Pelvis 2-3 Views Right  Result Date: 08/07/2016 CLINICAL DATA:  Initial evaluation for acute trauma, fall last week. Acute right hip pain. EXAM: DG HIP (WITH OR WITHOUT PELVIS) 2-3V RIGHT COMPARISON:  Prior radiograph from 08/01/2016. FINDINGS: Previously identified fracture involving the right greater trochanter again seen. There is new extension through the intertrochanteric right femur, with new 7-8 mm of displacement. Right femoral shaft is subluxed superiorly. Femoral head remains normally aligned within the acetabulum. No definite acetabular fracture.  Femoral head height is preserved. Remainder of the visualized bony pelvis is grossly intact. No acute injury about the left hip identified. Degenerative changes noted within the lower lumbar spine. Diffuse osteopenia noted. Soft tissue swelling overlies the right hip. IMPRESSION: Recently identified fracture involving the greater trochanter of the right femur now demonstrates intertrochanteric extension with new 7-8 mm of displacement. Electronically Signed   By: Jeannine Boga M.D.   On: 08/07/2016 16:58    Review of Systems  Constitutional: Negative for chills and fever.  HENT: Positive for hearing loss.   Respiratory: Negative for shortness of breath.   Cardiovascular: Negative for chest pain.  Musculoskeletal: Positive for myalgias.  Neurological:       Difficulty with balance  All other systems reviewed and are negative.  Blood pressure (!) 146/71, pulse (!) 55, temperature 97.6 F (36.4 C), temperature source Oral, resp. rate 16, height 5' (1.524 m), weight 109 lb 3.2 oz (49.5 kg), SpO2 98 %. Physical Exam  Constitutional: He is oriented to person, place, and time. He appears well-developed and well-nourished. No distress.  HENT:  Head: Normocephalic and atraumatic.  Right Ear: External ear normal.  Left Ear: External ear normal.  Eyes: Conjunctivae and EOM are normal. Pupils are equal, round, and reactive to light. Right eye exhibits no discharge. Left eye exhibits no discharge. No scleral icterus.  Neck: Normal range of motion. Neck supple. No JVD present. No tracheal deviation present. No thyromegaly present.  Cardiovascular: Normal rate, regular rhythm and intact distal pulses.   Respiratory: Effort normal and breath sounds normal. No stridor. No respiratory distress. He has no wheezes. He exhibits no tenderness.  GI: Soft. Bowel sounds are normal. He exhibits no distension and no mass. There is no tenderness.  Musculoskeletal:  Gait normal   Lymphadenopathy:    He has no  cervical adenopathy.  Neurological: He is alert and oriented to person, place, and time. He has normal reflexes. He displays normal reflexes. No cranial nerve deficit. He exhibits normal muscle tone. Coordination normal.  Skin: Skin is warm and dry. No rash noted. He is not diaphoretic. No erythema. No pallor.  Psychiatric: He has a normal mood and affect. His behavior is normal. Judgment and thought content normal.   Right upper extremity normal to palpation normal range of motion all joints stable reduced muscle tone and strength normal skin normal pulses good temperature normal no edema lymph nodes negative sensation normal and Hoffmann sign negative  Left upper extremity normal range of motion stability strength and alignment no tenderness skin intact good 2+ distal pulses no edema lymph nodes are normal pulses and sensation are normal Hoffmann sign negative  Coordination and testing not done in the upper extremities due to patient's age and understanding of testing.  Left lower extremity normal range of motion stability strength and alignment skin pulse and sensation.  Right upper extremity tenderness in the proximal femur and hip painful internal rotation external rotation leg is in external rotation short position stability deferred because of pain muscle tone normal skin intact pulses good lymph nodes negative sensation normal Babinski sign downgoing and coordination deferred because of pain and fracture   Assessment/Plan: Intertrochanteric fracture right hip  Recommend internal fixation with gamma nail  Arther Abbott 08/08/2016, 7:53 AM

## 2016-08-09 ENCOUNTER — Inpatient Hospital Stay (HOSPITAL_COMMUNITY): Payer: Medicare Other

## 2016-08-09 ENCOUNTER — Encounter (HOSPITAL_COMMUNITY): Payer: Self-pay | Admitting: *Deleted

## 2016-08-09 ENCOUNTER — Inpatient Hospital Stay (HOSPITAL_COMMUNITY): Payer: Medicare Other | Admitting: Anesthesiology

## 2016-08-09 ENCOUNTER — Encounter (HOSPITAL_COMMUNITY)
Admission: EM | Disposition: A | Discharge status: Home / Self Care | Payer: Self-pay | Source: Home / Self Care | Attending: Internal Medicine

## 2016-08-09 DIAGNOSIS — S72141A Displaced intertrochanteric fracture of right femur, initial encounter for closed fracture: Secondary | ICD-10-CM

## 2016-08-09 DIAGNOSIS — S72001D Fracture of unspecified part of neck of right femur, subsequent encounter for closed fracture with routine healing: Secondary | ICD-10-CM

## 2016-08-09 HISTORY — PX: INTRAMEDULLARY (IM) NAIL INTERTROCHANTERIC: SHX5875

## 2016-08-09 SURGERY — FIXATION, FRACTURE, INTERTROCHANTERIC, WITH INTRAMEDULLARY ROD
Anesthesia: Spinal | Site: Hip | Laterality: Right

## 2016-08-09 MED ORDER — ONDANSETRON HCL 4 MG/2ML IJ SOLN: 4.0000 mg | Freq: Four times a day (QID) | INTRAMUSCULAR | Status: DC | PRN

## 2016-08-09 MED ORDER — PROPOFOL 500 MG/50ML IV EMUL
INTRAVENOUS | Status: DC | PRN
  Administered 2016-08-09: 25 ug/kg/min via INTRAVENOUS

## 2016-08-09 MED ORDER — FENTANYL CITRATE (PF) 100 MCG/2ML IJ SOLN
INTRAMUSCULAR | Status: DC | PRN
  Administered 2016-08-09: 25 ug via INTRAVENOUS

## 2016-08-09 MED ORDER — METOCLOPRAMIDE HCL 5 MG/ML IJ SOLN
5.0000 mg | Freq: Three times a day (TID) | INTRAMUSCULAR | Status: DC | PRN
Start: 2016-08-09 — End: 2016-08-11

## 2016-08-09 MED ORDER — EPHEDRINE SULFATE 50 MG/ML IJ SOLN
INTRAMUSCULAR | Status: DC | PRN
  Administered 2016-08-09 (×2): 10 mg via INTRAVENOUS

## 2016-08-09 MED ORDER — FENTANYL CITRATE (PF) 100 MCG/2ML IJ SOLN
25.0000 ug | INTRAMUSCULAR | Status: DC | PRN
  Administered 2016-08-09: 25 ug via INTRAVENOUS

## 2016-08-09 MED ORDER — MENTHOL 3 MG MT LOZG: 1.0000 | LOZENGE | OROMUCOSAL | Status: DC | PRN

## 2016-08-09 MED ORDER — EPHEDRINE SULFATE 50 MG/ML IJ SOLN
INTRAMUSCULAR | Status: AC
  Filled 2016-08-09: qty 1

## 2016-08-09 MED ORDER — ONDANSETRON HCL 4 MG PO TABS: 4.0000 mg | ORAL_TABLET | Freq: Four times a day (QID) | ORAL | Status: DC | PRN

## 2016-08-09 MED ORDER — FENTANYL CITRATE (PF) 100 MCG/2ML IJ SOLN
INTRAMUSCULAR | Status: AC
  Filled 2016-08-09: qty 2

## 2016-08-09 MED ORDER — PROPOFOL 10 MG/ML IV BOLUS
INTRAVENOUS | Status: AC
  Filled 2016-08-09: qty 20

## 2016-08-09 MED ORDER — BUPIVACAINE IN DEXTROSE 0.75-8.25 % IT SOLN
INTRATHECAL | Status: AC
  Filled 2016-08-09: qty 2

## 2016-08-09 MED ORDER — BUPIVACAINE IN DEXTROSE 0.75-8.25 % IT SOLN
INTRATHECAL | Status: DC | PRN
  Administered 2016-08-09: 12 mg via INTRATHECAL

## 2016-08-09 MED ORDER — BUPIVACAINE-EPINEPHRINE (PF) 0.25% -1:200000 IJ SOLN
INTRAMUSCULAR | Status: AC
  Filled 2016-08-09: qty 60

## 2016-08-09 MED ORDER — ASPIRIN EC 325 MG PO TBEC
325.0000 mg | DELAYED_RELEASE_TABLET | Freq: Every day | ORAL | Status: DC
  Administered 2016-08-10 – 2016-08-11 (×2): 325 mg via ORAL
  Filled 2016-08-09 (×2): qty 1

## 2016-08-09 MED ORDER — SODIUM CHLORIDE 0.9 % IR SOLN
Status: DC | PRN
  Administered 2016-08-09: 1000 mL

## 2016-08-09 MED ORDER — SODIUM CHLORIDE 0.9 % IJ SOLN
INTRAMUSCULAR | Status: AC
  Filled 2016-08-09: qty 10

## 2016-08-09 MED ORDER — MIDAZOLAM HCL 2 MG/2ML IJ SOLN
INTRAMUSCULAR | Status: AC
  Filled 2016-08-09: qty 2

## 2016-08-09 MED ORDER — MIDAZOLAM HCL 2 MG/2ML IJ SOLN
0.5000 mg | INTRAMUSCULAR | Status: DC | PRN
  Administered 2016-08-09: 2 mg via INTRAVENOUS

## 2016-08-09 MED ORDER — PHENYLEPHRINE HCL 10 MG/ML IJ SOLN
INTRAMUSCULAR | Status: DC | PRN
  Administered 2016-08-09 (×5): 40 ug via INTRAVENOUS

## 2016-08-09 MED ORDER — NALOXONE HCL 0.4 MG/ML IJ SOLN
INTRAMUSCULAR | Status: AC
  Filled 2016-08-09: qty 1

## 2016-08-09 MED ORDER — PHENYLEPHRINE 40 MCG/ML (10ML) SYRINGE FOR IV PUSH (FOR BLOOD PRESSURE SUPPORT)
PREFILLED_SYRINGE | INTRAVENOUS | Status: AC
  Filled 2016-08-09: qty 10

## 2016-08-09 MED ORDER — PHENOL 1.4 % MT LIQD: 1.0000 | OROMUCOSAL | Status: DC | PRN

## 2016-08-09 MED ORDER — LACTATED RINGERS IV SOLN
INTRAVENOUS | Status: DC
  Administered 2016-08-09: 1000 mL via INTRAVENOUS

## 2016-08-09 MED ORDER — HYDROMORPHONE HCL 1 MG/ML IJ SOLN: 0.2500 mg | INTRAMUSCULAR | Status: DC | PRN

## 2016-08-09 MED ORDER — BUPIVACAINE-EPINEPHRINE (PF) 0.25% -1:200000 IJ SOLN
INTRAMUSCULAR | Status: DC | PRN
  Administered 2016-08-09: 60 mL via PERINEURAL

## 2016-08-09 MED ORDER — METOCLOPRAMIDE HCL 10 MG PO TABS: 5.0000 mg | ORAL_TABLET | Freq: Three times a day (TID) | ORAL | Status: DC | PRN

## 2016-08-09 SURGICAL SUPPLY — 58 items
BAG HAMPER (MISCELLANEOUS) ×3 IMPLANT
BIT DRILL AO GAMMA 4.2X300 (BIT) ×6 IMPLANT
BLADE HEX COATED 2.75 (ELECTRODE) ×3 IMPLANT
BLADE SURG SZ10 CARB STEEL (BLADE) ×6 IMPLANT
BNDG GAUZE ELAST 4 BULKY (GAUZE/BANDAGES/DRESSINGS) ×3 IMPLANT
CHLORAPREP W/TINT 26ML (MISCELLANEOUS) ×3 IMPLANT
CLOTH BEACON ORANGE TIMEOUT ST (SAFETY) ×3 IMPLANT
COVER LIGHT HANDLE STERIS (MISCELLANEOUS) ×6 IMPLANT
COVER MAYO STAND XLG (DRAPE) ×3 IMPLANT
DECANTER SPIKE VIAL GLASS SM (MISCELLANEOUS) ×6 IMPLANT
DRAPE STERI IOBAN 125X83 (DRAPES) ×3 IMPLANT
DRSG AQUACEL AG ADV 3.5X10 (GAUZE/BANDAGES/DRESSINGS) IMPLANT
DRSG MEPILEX BORDER 4X12 (GAUZE/BANDAGES/DRESSINGS) ×3 IMPLANT
ELECT REM PT RETURN 9FT ADLT (ELECTROSURGICAL) ×3
ELECTRODE REM PT RTRN 9FT ADLT (ELECTROSURGICAL) ×1 IMPLANT
GLOVE BIOGEL M 7.0 STRL (GLOVE) ×3 IMPLANT
GLOVE BIOGEL PI IND STRL 7.0 (GLOVE) ×2 IMPLANT
GLOVE BIOGEL PI INDICATOR 7.0 (GLOVE) ×4
GLOVE ECLIPSE 6.5 STRL STRAW (GLOVE) ×3 IMPLANT
GLOVE EXAM NITRILE MD LF STRL (GLOVE) ×3 IMPLANT
GLOVE SKINSENSE NS SZ8.0 LF (GLOVE) ×2
GLOVE SKINSENSE STRL SZ8.0 LF (GLOVE) ×1 IMPLANT
GLOVE SS N UNI LF 8.5 STRL (GLOVE) ×3 IMPLANT
GOWN STRL REUS W/TWL LRG LVL3 (GOWN DISPOSABLE) ×9 IMPLANT
GOWN STRL REUS W/TWL XL LVL3 (GOWN DISPOSABLE) ×3 IMPLANT
GUIDEROD T2 3X1000 (ROD) ×3 IMPLANT
INST SET MAJOR BONE (KITS) ×3 IMPLANT
K-WIRE  3.2X450M STR (WIRE) ×2
K-WIRE 3.2X450M STR (WIRE) ×1
KIT BLADEGUARD II DBL (SET/KITS/TRAYS/PACK) ×3 IMPLANT
KIT ROOM TURNOVER AP CYSTO (KITS) ×3 IMPLANT
KWIRE 3.2X450M STR (WIRE) ×1 IMPLANT
MANIFOLD NEPTUNE II (INSTRUMENTS) ×3 IMPLANT
MARKER SKIN DUAL TIP RULER LAB (MISCELLANEOUS) ×3 IMPLANT
NAIL TROCH GAMMA 11X18 (Nail) ×3 IMPLANT
NEEDLE HYPO 21X1.5 SAFETY (NEEDLE) ×3 IMPLANT
NEEDLE SPNL 18GX3.5 QUINCKE PK (NEEDLE) ×3 IMPLANT
NS IRRIG 1000ML POUR BTL (IV SOLUTION) ×3 IMPLANT
PACK BASIC III (CUSTOM PROCEDURE TRAY) ×2
PACK SRG BSC III STRL LF ECLPS (CUSTOM PROCEDURE TRAY) ×1 IMPLANT
PENCIL HANDSWITCHING (ELECTRODE) ×3 IMPLANT
REAMER SHAFT BIXCUT (INSTRUMENTS) ×3 IMPLANT
SCREW LAG GAMMA 3 95MM (Screw) ×3 IMPLANT
SCREW LOCKING T2 F/T  5MMX40MM (Screw) ×2 IMPLANT
SCREW LOCKING T2 F/T 5MMX40MM (Screw) ×1 IMPLANT
SET BASIN LINEN APH (SET/KITS/TRAYS/PACK) ×3 IMPLANT
SLING ARM FOAM STRAP LRG (SOFTGOODS) IMPLANT
SLING ARM FOAM STRAP MED (SOFTGOODS) IMPLANT
SLING ARM FOAM STRAP XLG (SOFTGOODS) IMPLANT
SPONGE LAP 18X18 X RAY DECT (DISPOSABLE) ×6 IMPLANT
STAPLER VISISTAT 35W (STAPLE) ×3 IMPLANT
SUT BRALON NAB BRD #1 30IN (SUTURE) ×3 IMPLANT
SUT MNCRL 0 VIOLET CTX 36 (SUTURE) ×1 IMPLANT
SUT MON AB 2-0 CT1 36 (SUTURE) ×3 IMPLANT
SUT MONOCRYL 0 CTX 36 (SUTURE) ×2
SYR 30ML LL (SYRINGE) ×3 IMPLANT
SYR BULB IRRIGATION 50ML (SYRINGE) ×6 IMPLANT
YANKAUER SUCT BULB TIP NO VENT (SUCTIONS) ×3 IMPLANT

## 2016-08-09 NOTE — Progress Notes (Signed)
PT in room with bed in lowest position with alarm on Call light within reach. Door open.

## 2016-08-09 NOTE — Anesthesia Postprocedure Evaluation (Signed)
Anesthesia Post Note  Patient: Jonathan Valencia  Procedure(s) Performed: Procedure(s) (LRB): INTERNAL FIXATION RIGHT HIP (Right)  Patient location during evaluation: PACU Anesthesia Type: Spinal Level of consciousness: awake and confused Pain management: pain level controlled Vital Signs Assessment: post-procedure vital signs reviewed and stable Respiratory status: spontaneous breathing and patient connected to nasal cannula oxygen Cardiovascular status: stable Postop Assessment: no signs of nausea or vomiting and spinal receding Anesthetic complications: no     Last Vitals:  Vitals:   08/09/16 1220 08/09/16 1400  BP: 119/80 (!) 102/56  Pulse:  82  Resp: 14 16  Temp:  36.6 C    Last Pain:  Vitals:   08/09/16 1400  TempSrc:   PainSc: Asleep                 Daphyne Miguez A

## 2016-08-09 NOTE — Op Note (Signed)
08/09/2016  1:56 PM  PATIENT:  Jonathan Valencia  83 y.o. male  PRE-OPERATIVE DIAGNOSIS:  right hip fracture peritrochanteric fracture  POST-OPERATIVE DIAGNOSIS:  right hip fracture peritrochanteric fracture  PROCEDURE:  Procedure(s): INTERNAL FIXATION RIGHT HIP (Right) with short gamma nail, 125, 95 lag screw, proximal set screw and sliding mode and a 40 mm distal locking bolt  Operative findings 2 main fracture fragments in the peritrochanteric region with comminuted greater trochanter and apex posterior angulation  Surgery done as follows the patient was identified in the preop area correctly and his right hip was confirmed as a surgical site and marked. He was taken to surgery where a spinal anesthetic. He was then placed on the fracture table with appropriate padding on his well leg in a padded perineal post with the right leg in traction. C-arm was brought in a provisional reduction was done under C-arm guidance. We got a stable reduction with a posterior medial buttress intact  Sterile prep and drape were performed  Timeout was completed  The incision was made at the tip of the trochanter extended proximally divided down to the fascia which was split in line with the skin incision. Finger dissection confirmed palpation of the greater trochanter and a curved awl was passed down the skin the femoral canal and this was confirmed with AP and lateral radiographs  We then passed a guidewire down to the knee confirm that on x-ray  We then proximally reamed the femur with the proximal reamer and then passed 125 nail however, the nail did not pass so we removed it and reamed with flexible reamers with 10, 11 and 12 mm  The nail then passed easily  Made a second stab wound laterally past the cannula down to bone we passed the perforating drill bit to open up the lateral femoral bone and then passed a threaded tip guidewire into the center the femoral head. This was confirmed on x-ray in 2  planes.  We measured the guidewire to be 95 mm and then passed a triple reamer over the guidewire. Once the lag screw was in place we passed the proximal set screw and backed it one quarter turn after toggling the lag screw driver handle to confirm engagement  We then passed a cannula for the distal locking bolt made a small incision carried the cannula and pointed tip trocar down to bone confirmed that the cane was down to bone and then drilled across it and then measured off the drill bit. We passed a 40 mm screw and confirmed x-ray there. All x-rays were taken and the site of the fracture was reduced and the hardware was in good position  We irrigated all wounds  We closed the proximal wound with #1 Surgilon and 0 Monocryl. We close the second limb with 0 Monocryl. We closed all 3 wounds with staples. We injected 60 mL of Marcaine with epinephrine divided between the 3 wounds  Place 1 sterile dressing  The patient was taken off the fracture table and taken to the recovery room in stable condition   postoperative plan   weightbearing as tolerated  Staples out at postop day 12-14  Aspirin can be used for surgical prophylaxis for 28 days  X-rays at 2, 6 and 12 weeks  Postop visit #1 at week #2    Assisted by Betty Ashley  Anesthesia spinal  No blood was given no drains were placed  We put in Court percent Marcaine with epinephrine 60 mL there were no   specimens  Counts were correct  Dragon dictation  SURGEON:  Surgeon(s) and Role:    * Verlisa Vara E Eyleen Rawlinson, MD - Primary  PHYSICIAN ASSISTANT:     EBL:  Total I/O In: 300 [I.V.:300] Out: 300 [Urine:250; Blood:50]   PLAN OF CARE: Admit to inpatient   PATIENT DISPOSITION:  PACU - hemodynamically stable.   Delay start of Pharmacological VTE agent (>24hrs) due to surgical blood loss or risk of bleeding: yes  [id number]  

## 2016-08-09 NOTE — Transfer of Care (Signed)
Immediate Anesthesia Transfer of Care Note  Patient: Jonathan Valencia  Procedure(s) Performed: Procedure(s): INTERNAL FIXATION RIGHT HIP (Right)  Patient Location: PACU  Anesthesia Type:MAC and Spinal  Level of Consciousness: sedated  Airway & Oxygen Therapy: Patient Spontanous Breathing and Patient connected to face mask oxygen  Post-op Assessment: Report given to RN and Post -op Vital signs reviewed and stable  Post vital signs: Reviewed and stable  Last Vitals:  Vitals:   08/09/16 1215 08/09/16 1220  BP: 120/70 119/80  Pulse:    Resp: (!) 25 14  Temp:      Last Pain:  Vitals:   08/09/16 1110  TempSrc: Oral  PainSc: 0-No pain      Patients Stated Pain Goal: 5 (56/38/93 7342)  Complications: No apparent anesthesia complications

## 2016-08-09 NOTE — Progress Notes (Signed)
OT Cancellation Note  Patient Details Name: Vickey HugerLawrence Manus MRN: [id number] DOB: October 01, 1926   Cancelled Treatment:     Reason evaluation not completed: Pt not medically ready. Chart reviewed, Dr. Romeo AppleHarrison has been consulted and pt is to have ORIF today. Will discontinue initial OT orders at this time, please re-order when appropriate post-surgery.    Ezra SitesLeslie Clever Geraldo, OTR/L  [phone] 08/09/2016, 8:20 AM

## 2016-08-09 NOTE — Anesthesia Procedure Notes (Signed)
Spinal  Patient location during procedure: OR Start time: 08/09/2016 12:43 PM Staffing Resident/CRNA: Joycelyn ManIDACAVAGE, Shanera Meske J Preanesthetic Checklist Completed: patient identified, site marked, surgical consent, pre-op evaluation, timeout performed, IV checked, risks and benefits discussed and monitors and equipment checked Spinal Block Patient position: right lateral decubitus Prep: Betadine Patient monitoring: heart rate, cardiac monitor, continuous pulse ox and blood pressure Approach: right paramedian Location: L3-4 Injection technique: single-shot Needle Needle type: Spinocan  Needle gauge: 22 G Needle length: 9 cm Assessment Sensory level: T8 Additional Notes Slow,steady free flow CSF.    [id number]          17 Jul 2017

## 2016-08-09 NOTE — Brief Op Note (Addendum)
08/09/2016  1:56 PM  PATIENT:  Jonathan Valencia  83 y.o. male  PRE-OPERATIVE DIAGNOSIS:  right hip fracture peritrochanteric fracture  POST-OPERATIVE DIAGNOSIS:  right hip fracture peritrochanteric fracture  PROCEDURE:  Procedure(s): INTERNAL FIXATION RIGHT HIP (Right) with short gamma nail, 125, 95 lag screw, proximal set screw and sliding mode and a 40 mm distal locking bolt  Operative findings 2 main fracture fragments in the peritrochanteric region with comminuted greater trochanter and apex posterior angulation  Surgery done as follows the patient was identified in the preop area correctly and his right hip was confirmed as a surgical site and marked. He was taken to surgery where a spinal anesthetic. He was then placed on the fracture table with appropriate padding on his well leg in a padded perineal post with the right leg in traction. C-arm was brought in a provisional reduction was done under C-arm guidance. We got a stable reduction with a posterior medial buttress intact  Sterile prep and drape were performed  Timeout was completed  The incision was made at the tip of the trochanter extended proximally divided down to the fascia which was split in line with the skin incision. Finger dissection confirmed palpation of the greater trochanter and a curved awl was passed down the skin the femoral canal and this was confirmed with AP and lateral radiographs  We then passed a guidewire down to the knee confirm that on x-ray  We then proximally reamed the femur with the proximal reamer and then passed 125 nail however, the nail did not pass so we removed it and reamed with flexible reamers with 10, 11 and 12 mm  The nail then passed easily  Made a second stab wound laterally past the cannula down to bone we passed the perforating drill bit to open up the lateral femoral bone and then passed a threaded tip guidewire into the center the femoral head. This was confirmed on x-ray in 2  planes.  We measured the guidewire to be 95 mm and then passed a triple reamer over the guidewire. Once the lag screw was in place we passed the proximal set screw and backed it one quarter turn after toggling the lag screw driver handle to confirm engagement  We then passed a cannula for the distal locking bolt made a small incision carried the cannula and pointed tip trocar down to bone confirmed that the cane was down to bone and then drilled across it and then measured off the drill bit. We passed a 40 mm screw and confirmed x-ray there. All x-rays were taken and the site of the fracture was reduced and the hardware was in good position  We irrigated all wounds  We closed the proximal wound with #1 Surgilon and 0 Monocryl. We close the second limb with 0 Monocryl. We closed all 3 wounds with staples. We injected 60 mL of Marcaine with epinephrine divided between the 3 wounds  Place 1 sterile dressing  The patient was taken off the fracture table and taken to the recovery room in stable condition   postoperative plan   weightbearing as tolerated  Staples out at postop day 12-14  Aspirin can be used for surgical prophylaxis for 28 days  X-rays at 2, 6 and 12 weeks  Postop visit #1 at week #2    Assisted by Robin [place] NationBetty Ashley  Anesthesia spinal  No blood was given no drains were placed  We put in Court percent Marcaine with epinephrine 60 mL there were no  specimens  Counts were correct  Dragon dictation  SURGEON:  Surgeon(s) and Role:    * Vickki Hearing, MD - Primary  PHYSICIAN ASSISTANT:     EBL:  Total I/O In: 300 [I.V.:300] Out: 300 [Urine:250; Blood:50]   PLAN OF CARE: Admit to inpatient   PATIENT DISPOSITION:  PACU - hemodynamically stable.   Delay start of Pharmacological VTE agent (>24hrs) due to surgical blood loss or risk of bleeding: yes  27145

## 2016-08-09 NOTE — Progress Notes (Signed)
PROGRESS NOTE    Jonathan Valencia  ZOX:[id number] DOB: 1927-06-07 DOA: 08/07/2016 PCP: PROVIDER NOT IN SYSTEM    Brief Narrative:  83 yo with mechanical fall, hx of dementia, lives at home with daughter, admitted after fall with right hip fracture.  Dr Romeo Apple of orthopedics had seen him and planned ORIF today. He has no complaints.  He was able to tell that he fell and hurt his hip.  He actually had fallen several times, and had known hip Fx, but Dr Magnus Ivan at that time did not feel he required surgical correction.  Follow up imaging after his recent fall showed more displacement.  Family also felt that he can no longer be adequately cared for at home.   Assessment & Plan:   Principal Problem:   Fracture of greater trochanter of right femur (HCC) Active Problems:   Dementia   Falls frequently   Right hip pain   Hip fracture (HCC)  1. Hip Fx:  Getting ORIF today.  Continue with IVF and pain medication. 2. HTN:  BP is controlled.  WIll continue to follow. 3. Dementia:  He has situational awareness, and able to converse meaningfully.           4 .   Placement:  Consult CM for placement after ORIF.   DVT prophylaxis: SCD.  Add ASA.  Code Status: FULL CODE.  Family Communication: Daughter is aware.  Disposition Plan: SNF   Consultants:   Orthopedics:  Dr Romeo Apple.     Antimicrobials: Anti-infectives    Start     Dose/Rate Route Frequency Ordered Stop   08/09/16 0900  ceFAZolin (ANCEF) IVPB 2g/100 mL premix     2 g 200 mL/hr over 30 Minutes Intravenous On call to O.R. 08/08/16 0859 08/09/16 1245   08/08/16 0900  ceFAZolin (ANCEF) IVPB 2g/100 mL premix  Status:  Discontinued     2 g 200 mL/hr over 30 Minutes Intravenous On call to O.R. 08/08/16 8119 08/08/16 0859       Subjective:  Awake and alert.  No complaints.    Objective: Vitals:   08/09/16 1205 08/09/16 1210 08/09/16 1215 08/09/16 1220  BP: 115/72 129/68 120/70 119/80  Pulse:      Resp: (!) 24 16 (!) 25  14  Temp:      TempSrc:      SpO2: 97% 97% 97% 96%  Weight:      Height:        Intake/Output Summary (Last 24 hours) at 08/09/16 1309 Last data filed at 08/09/16 1223  Gross per 24 hour  Intake              220 ml  Output              650 ml  Net             -430 ml   Filed Weights   08/07/16 1456 08/07/16 2143 08/09/16 1110  Weight: 61.2 kg (135 lb) 49.5 kg (109 lb 3.2 oz) 49.4 kg (109 lb)    Examination:  General exam: Appears calm and comfortable  Respiratory system: Clear to auscultation. Respiratory effort normal. Cardiovascular system: S1 & S2 heard, RRR. No JVD, murmurs, rubs, gallops or clicks. No pedal edema. Gastrointestinal system: Abdomen is nondistended, soft and nontender. No organomegaly or masses felt. Normal bowel sounds heard. Central nervous system: Alert and oriented. No focal neurological deficits. Extremities: Symmetric 5 x 5 power. Skin: No rashes, lesions or ulcers Psychiatry: Judgement and insight appear  normal. Mood & affect appropriate.   Data Reviewed: I have personally reviewed following labs and imaging studies  CBC:  Recent Labs Lab 08/07/16 1517 08/08/16 0619  WBC 15.7* 14.2*  NEUTROABS 13.1*  --   HGB 12.9* 13.7  HCT 37.9* 40.7  MCV 88.8 88.9  PLT 416* 336   Basic Metabolic Panel:  Recent Labs Lab 08/07/16 1517 08/08/16 0619  NA 137 137  K 3.6 3.6  CL 102 100*  CO2 23 27  GLUCOSE 191* 137*  BUN 22* 14  CREATININE 1.00 0.66  CALCIUM 9.4 9.3   GFR: Estimated Creatinine Clearance: 45.5 mL/min (by C-G formula based on SCr of 0.66 mg/dL). Liver Function Tests:  Recent Labs Lab 08/08/16 0619  AST 30  ALT 19  ALKPHOS 107  BILITOT 2.5*  PROT 6.5  ALBUMIN 3.4*   Sepsis Labs:  Recent Labs Lab 08/07/16 1913  LATICACIDVEN 1.1    Recent Results (from the past 240 hour(s))  Surgical pcr screen     Status: None   Collection Time: 08/08/16  8:51 AM  Result Value Ref Range Status   MRSA, PCR NEGATIVE NEGATIVE  Final   Staphylococcus aureus NEGATIVE NEGATIVE Final    Comment:        The Xpert SA Assay (FDA approved for NASAL specimens in patients over 81 years of age), is one component of a comprehensive surveillance program.  Test performance has been validated by [facility] for patients greater than or equal to 81 year old. It is not intended to diagnose infection nor to guide or monitor treatment.      Radiology Studies: Dg Chest 1 View  Result Date: 08/07/2016 CLINICAL DATA:  Initial evaluation for acute trauma, fall. EXAM: CHEST 1 VIEW COMPARISON:  None available. FINDINGS: Transverse heart size at the upper limits of normal. Mediastinal silhouette within normal limits. Lungs mildly hypoinflated. Chronic coarsening of the interstitial markings present. No focal infiltrates. No pulmonary edema or pleural effusion. No pneumothorax. Calcified granuloma overlies the mid left lung. No acute osseous abnormality.  Bones are diffusely demineralized. IMPRESSION: Chronic coarsening of the interstitial markings with no active cardiopulmonary disease identified. Electronically Signed   By: Rise MuBenjamin  McClintock M.D.   On: 08/07/2016 16:59   Ct Hip Right Wo Contrast  Result Date: 08/07/2016 CLINICAL DATA:  Right hip fracture EXAM: CT OF THE RIGHT HIP WITHOUT CONTRAST TECHNIQUE: Multidetector CT imaging of the right hip was performed according to the standard protocol. Multiplanar CT image reconstructions were also generated. COMPARISON:  08/07/2016 and 08/01/2016 radiographs of the right hip FINDINGS: Bones/Joint/Cartilage Comminuted varus angulated intratrochanteric fracture of the right femur is noted with fracture undermining the greater trochanter as well. Joint space narrowing with subchondral degenerate cystic lucencies of the acetabular roof superolaterally. No right-sided pubic rami fracture nor right acetabular fracture noted. Joint space narrowing along the weight-bearing portion of the right hip  consistent with cartilaginous thinning and osteoarthritis. Small to moderate posttraumatic joint effusion. Ligaments Not well visualized by CT. Muscles and Tendons No intramuscular hemorrhage.  No focal soft tissue mass. Soft tissues Periarticular soft tissue swelling and edema secondary to fracture. IMPRESSION: Acute, closed, comminuted varus angulated intertrochanteric fracture of the right femur. Fracture undermining the greater trochanter as well. Small to moderate joint effusion. No acetabular fracture. No malalignment of the hip joint. Electronically Signed   By: Tollie Ethavid  Kwon M.D.   On: 08/07/2016 19:41   Dg Hip Unilat With Pelvis 2-3 Views Right  Result Date: 08/07/2016 CLINICAL DATA:  Initial evaluation for acute trauma, fall last week. Acute right hip pain. EXAM: DG HIP (WITH OR WITHOUT PELVIS) 2-3V RIGHT COMPARISON:  Prior radiograph from 08/01/2016. FINDINGS: Previously identified fracture involving the right greater trochanter again seen. There is new extension through the intertrochanteric right femur, with new 7-8 mm of displacement. Right femoral shaft is subluxed superiorly. Femoral head remains normally aligned within the acetabulum. No definite acetabular fracture. Femoral head height is preserved. Remainder of the visualized bony pelvis is grossly intact. No acute injury about the left hip identified. Degenerative changes noted within the lower lumbar spine. Diffuse osteopenia noted. Soft tissue swelling overlies the right hip. IMPRESSION: Recently identified fracture involving the greater trochanter of the right femur now demonstrates intertrochanteric extension with new 7-8 mm of displacement. Electronically Signed   By: Rise Mu M.D.   On: 08/07/2016 16:58    Scheduled Meds: . [MAR Hold] sodium chloride   Intravenous Once  . [MAR Hold] aspirin EC  81 mg Oral Daily  . [MAR Hold] divalproex  125 mg Oral Q1500  . [MAR Hold] donepezil  10 mg Oral Daily  . povidone-iodine  2  application Topical Once  . [MAR Hold] sodium chloride flush  3 mL Intravenous Q12H  . [MAR Hold] traZODone  25 mg Oral QHS   Continuous Infusions: . lactated ringers 1,000 mL (08/09/16 1200)     LOS: 2 days   Talib Headley, MD FACP Hospitalist.   If 7PM-7AM, please contact night-coverage [email or web address] Password [email or web address] 08/09/2016, 1:09 PM

## 2016-08-09 NOTE — Anesthesia Preprocedure Evaluation (Signed)
Anesthesia Evaluation  Patient identified by MRN, date of birth, ID band Patient awake    Reviewed: Allergy & Precautions, NPO status , Patient's Chart, lab work & pertinent test results  Airway Mallampati: II  TM Distance: >3 FB     Dental  (+) Edentulous Upper, Edentulous Lower   Pulmonary neg pulmonary ROS,    breath sounds clear to auscultation       Cardiovascular hypertension, Pt. on medications  Rhythm:Regular Rate:Normal     Neuro/Psych PSYCHIATRIC DISORDERS (dementia hx, answers questions appropriately)    GI/Hepatic negative GI ROS,   Endo/Other    Renal/GU      Musculoskeletal   Abdominal   Peds  Hematology   Anesthesia Other Findings   Reproductive/Obstetrics                             Anesthesia Physical Anesthesia Plan  ASA: III  Anesthesia Plan: Spinal   Post-op Pain Management:    Induction:   Airway Management Planned: Simple Face Mask  Additional Equipment:   Intra-op Plan:   Post-operative Plan:   Informed Consent: I have reviewed the patients History and Physical, chart, labs and discussed the procedure including the risks, benefits and alternatives for the proposed anesthesia with the patient or authorized representative who has indicated his/her understanding and acceptance.     Plan Discussed with:   Anesthesia Plan Comments:         Anesthesia Quick Evaluation

## 2016-08-10 DIAGNOSIS — F0150 Vascular dementia without behavioral disturbance: Secondary | ICD-10-CM

## 2016-08-10 DIAGNOSIS — S72111A Displaced fracture of greater trochanter of right femur, initial encounter for closed fracture: Principal | ICD-10-CM

## 2016-08-10 LAB — BASIC METABOLIC PANEL
Anion gap: 9 (ref 5–15)
BUN: 16 mg/dL (ref 6–20)
CALCIUM: 8.9 mg/dL (ref 8.9–10.3)
CO2: 25 mmol/L (ref 22–32)
CREATININE: 0.69 mg/dL (ref 0.61–1.24)
Chloride: 99 mmol/L — ABNORMAL LOW (ref 101–111)
GFR calc non Af Amer: >60 mL/min (ref >60)
Glucose, Bld: 151 mg/dL — ABNORMAL HIGH (ref 65–99)
Potassium: 3.7 mmol/L (ref 3.5–5.1)
SODIUM: 133 mmol/L — AB (ref 135–145)

## 2016-08-10 LAB — CBC
HEMATOCRIT: 37.5 % — AB (ref 39.0–52.0)
Hemoglobin: 12.7 g/dL — ABNORMAL LOW (ref 13.0–17.0)
MCH: 30 pg (ref 26.0–34.0)
MCHC: 33.9 g/dL (ref 30.0–36.0)
MCV: 88.4 fL (ref 78.0–100.0)
Platelets: 376 10*3/uL (ref 150–400)
RBC: 4.24 MIL/uL (ref 4.22–5.81)
RDW: 13.3 % (ref 11.5–15.5)
WBC: 16.8 10*3/uL — ABNORMAL HIGH (ref 4.0–10.5)

## 2016-08-10 MED ORDER — SODIUM CHLORIDE 0.9 % IV SOLN
INTRAVENOUS | Status: DC
  Administered 2016-08-10 (×2): via INTRAVENOUS

## 2016-08-10 NOTE — Progress Notes (Signed)
PROGRESS NOTE    Jonathan Valencia  ZOX:[id number] DOB: 09-26-1926 DOA: 08/07/2016 PCP: PROVIDER NOT IN SYSTEM    Brief Narrative:  83 yo with mechanical fall, hx of dementia, lives at home with daughter, admitted after fall with right hip fracture.  Dr Romeo Apple of orthopedics had seen him and planned ORIF on 1/23 .  he underwent surgery without any immediate complications. Plan is to discharge to skilled nursing facility when stable for physical therapy..   Assessment & Plan:   Principal Problem:   Fracture of greater trochanter of right femur (HCC) Active Problems:   Dementia   Falls frequently   Right hip pain   Hip fracture (HCC)   Closed intertrochanteric fracture, right, initial encounter (HCC)  1. Hip Fx:   patient underwent ORIF on 1/23 without any immediate complications.   he'll be seen by physical therapy. Continue pain management. 2. HTN:  BP is controlled.  WIll continue to follow. 3. Dementia:  He has situational awareness, and able to converse meaningfully.           4 .   Placement:  Consult social work for skilled nursing placement after ORIF.   DVT prophylaxis: SCD.  Add ASA.  Code Status: FULL CODE.  Family Communication:  discussed with son at the bedside.  Disposition Plan: SNF   Consultants:   Orthopedics:  Dr Romeo Apple.     Antimicrobials: Anti-infectives    Start     Dose/Rate Route Frequency Ordered Stop   08/09/16 0900  ceFAZolin (ANCEF) IVPB 2g/100 mL premix     2 g 200 mL/hr over 30 Minutes Intravenous On call to O.R. 08/08/16 0859 08/09/16 1245   08/08/16 0900  ceFAZolin (ANCEF) IVPB 2g/100 mL premix  Status:  Discontinued     2 g 200 mL/hr over 30 Minutes Intravenous On call to O.R. 08/08/16 8119 08/08/16 0859      Subjective:  Does not complain of pain. Feels okay.   Objective: Vitals:   08/10/16 0000 08/10/16 0538 08/10/16 0939 08/10/16 1338  BP:  137/81 (!) 109/56 114/73  Pulse:  89 78 85  Resp:  20 18 18   Temp:  98.2 F  (36.8 C) 97.8 F (36.6 C) 97.8 F (36.6 C)  TempSrc:  Oral Axillary Oral  SpO2: 97% 98% 97% 98%  Weight:      Height:        Intake/Output Summary (Last 24 hours) at 08/10/16 1729 Last data filed at 08/10/16 1537  Gross per 24 hour  Intake           343.75 ml  Output              600 ml  Net          -256.25 ml   Filed Weights   08/07/16 1456 08/07/16 2143 08/09/16 1110  Weight: 61.2 kg (135 lb) 49.5 kg (109 lb 3.2 oz) 49.4 kg (109 lb)    Examination:  General exam: Appears calm and comfortable  Respiratory system: Clear to auscultation. Respiratory effort normal. Cardiovascular system: S1 & S2 heard, RRR. No JVD, murmurs, rubs, gallops or clicks. No pedal edema. Gastrointestinal system: Abdomen is nondistended, soft and nontender. No organomegaly or masses felt. Normal bowel sounds heard. Central nervous system: Alert and oriented. No focal neurological deficits. Extremities: Symmetric 5 x 5 power. Skin: No rashes, lesions or ulcers Psychiatry: Judgement and insight appear normal. Mood & affect appropriate.   Data Reviewed: I have personally reviewed following labs and imaging  studies  CBC:  Recent Labs Lab 08/07/16 1517 08/08/16 0619 08/10/16 0406  WBC 15.7* 14.2* 16.8*  NEUTROABS 13.1*  --   --   HGB 12.9* 13.7 12.7*  HCT 37.9* 40.7 37.5*  MCV 88.8 88.9 88.4  PLT 416* 336 376   Basic Metabolic Panel:  Recent Labs Lab 08/07/16 1517 08/08/16 0619 08/10/16 0406  NA 137 137 133*  K 3.6 3.6 3.7  CL 102 100* 99*  CO2 23 27 25   GLUCOSE 191* 137* 151*  BUN 22* 14 16  CREATININE 1.00 0.66 0.69  CALCIUM 9.4 9.3 8.9   GFR: Estimated Creatinine Clearance: 45.5 mL/min (by C-G formula based on SCr of 0.69 mg/dL). Liver Function Tests:  Recent Labs Lab 08/08/16 0619  AST 30  ALT 19  ALKPHOS 107  BILITOT 2.5*  PROT 6.5  ALBUMIN 3.4*   Sepsis Labs:  Recent Labs Lab 08/07/16 1913  LATICACIDVEN 1.1    Recent Results (from the past 240 hour(s))    Surgical pcr screen     Status: None   Collection Time: 08/08/16  8:51 AM  Result Value Ref Range Status   MRSA, PCR NEGATIVE NEGATIVE Final   Staphylococcus aureus NEGATIVE NEGATIVE Final    Comment:        The Xpert SA Assay (FDA approved for NASAL specimens in patients over 81 years of age), is one component of a comprehensive surveillance program.  Test performance has been validated by [facility] for patients greater than or equal to 316 year old. It is not intended to diagnose infection nor to guide or monitor treatment.      Radiology Studies: Dg Hip Operative Unilat With Pelvis Right  Result Date: 08/09/2016 CLINICAL DATA:  Intramedullary right hip nailing EXAM: OPERATIVE RIGHT HIP (WITH PELVIS IF PERFORMED) 8 VIEWS TECHNIQUE: Fluoroscopic spot image(s) were submitted for interpretation post-operatively. COMPARISON:  None. FINDINGS: Multiple intraoperative spot images demonstrate internal fixation across the right proximal femur. No visible hardware complicating feature. IMPRESSION: Right proximal femoral internal fixation. No visible complicating feature. Electronically Signed   By: Charlett NoseKevin  Dover M.D.   On: 08/09/2016 14:00    Scheduled Meds: . sodium chloride   Intravenous Once  . aspirin EC  325 mg Oral Q breakfast  . divalproex  125 mg Oral Q1500  . donepezil  10 mg Oral Daily  . sodium chloride flush  3 mL Intravenous Q12H  . traZODone  25 mg Oral QHS   Continuous Infusions: . sodium chloride 75 mL/hr at 08/10/16 1025     LOS: 3 days   Jonathan Doepke, MD Hospitalist.   If 7PM-7AM, please contact night-coverage [email or web address] Password [email or web address] 08/10/2016, 5:29 PM

## 2016-08-10 NOTE — Progress Notes (Signed)
Subjective: 1 Day Post-Op Procedure(s) (LRB): INTERNAL FIXATION RIGHT HIP (Right) Patient reports pain as ?.    Objective: Vital signs in last 24 hours: Temp:  [97.6 F (36.4 C)-98.6 F (37 C)] 98.2 F (36.8 C) (01/24 0538) Pulse Rate:  [68-89] 89 (01/24 0538) Resp:  [11-25] 20 (01/24 0538) BP: (95-138)/(56-82) 137/81 (01/24 0538) SpO2:  [92 %-100 %] 98 % (01/24 0538) Weight:  [109 lb (49.4 kg)] 109 lb (49.4 kg) (01/23 1110)  Intake/Output from previous day: 01/23 0701 - 01/24 0700 In: 620 [P.O.:120; I.V.:500] Out: 600 [Urine:550; Blood:50] Intake/Output this shift: No intake/output data recorded.   Recent Labs  08/07/16 1517 08/08/16 0619 08/10/16 0406  HGB 12.9* 13.7 12.7*    Recent Labs  08/08/16 0619 08/10/16 0406  WBC 14.2* 16.8*  RBC 4.58 4.24  HCT 40.7 37.5*  PLT 336 376    Recent Labs  08/08/16 0619 08/10/16 0406  NA 137 133*  K 3.6 3.7  CL 100* 99*  CO2 27 25  BUN 14 16  CREATININE 0.66 0.69  GLUCOSE 137* 151*  CALCIUM 9.3 8.9   No results for input(s): LABPT, INR in the last 72 hours.   Assessment/Plan: 1 Day Post-Op Procedure(s) (LRB): INTERNAL FIXATION RIGHT HIP (Right) PT  Fuller CanadaStanley Harrison 08/10/2016, 8:49 AM

## 2016-08-10 NOTE — Clinical Social Work Placement (Signed)
   CLINICAL SOCIAL WORK PLACEMENT  NOTE  Date:  08/10/2016  Patient Details  Name: Jonathan Valencia MRN: [id number] Date of Birth: June 26, 1927  Clinical Social Work is seeking post-discharge placement for this patient at the Skilled  Nursing Facility level of care (*CSW will initial, date and re-position this form in  chart as items are completed):  Yes   Patient/family provided with [facility] Clinical Social Work Department's list of facilities offering this level of care within the geographic area requested by the patient (or if unable, by the patient's family).  Yes   Patient/family informed of their freedom to choose among providers that offer the needed level of care, that participate in Medicare, Medicaid or managed care program needed by the patient, have an available bed and are willing to accept the patient.  Yes   Patient/family informed of [facility]'s ownership interest in [facility] and [facility], as well as of the fact that they are under no obligation to receive care at these facilities.  PASRR submitted to EDS on 08/10/16     PASRR number received on 08/10/16     Existing PASRR number confirmed on       FL2 transmitted to all facilities in geographic area requested by pt/family on 08/10/16     FL2 transmitted to all facilities within larger geographic area on       Patient informed that his/her managed care company has contracts with or will negotiate with certain facilities, including the following:        Yes   Patient/family informed of bed offers received.  [facility]     Physician recommends and patient chooses bed at      Patient to [facility]  .  Patient to be transferred to facility by       Patient family notified on   of transfer.  Name of family member notified:        PHYSICIAN       Additional Comment:  CSW clarified with pt's daughter that pt was not wandering  outside the home and had no issues with this at previous SNF. [facility] updated.  _______________________________________________ Karn CassisStultz, Letica Giaimo Shanaberger, LCSW 08/10/2016, 2:33 PM [phone]

## 2016-08-10 NOTE — Clinical Social Work Note (Signed)
Clinical Social Work Assessment  Patient Details  Name: Jonathan Valencia MRN: [id number] Date of Birth: 12/29/1926  Date of referral:  08/10/16               Reason for consult:  Discharge Planning                Permission sought to share information with:  Chartered certified accountant granted to share information::  Yes, Verbal Permission Granted  Name::        Agency::  [facility]  Relationship::  past facility  Contact Information:     Housing/Transportation Living arrangements for the past 2 months:  [place], [place] of Information:  Adult Children Patient Interpreter Needed:  None Criminal Activity/Legal Involvement Pertinent to Current Situation/Hospitalization:  No - Comment as needed Significant Relationships:  Adult Children Lives with:  Adult Children Do you feel safe going back to the place where you live?  Yes Need for family participation in patient care:  Yes (Comment)  Care giving concerns:  Pt has been alone some during the day and now will require SNF.    Social Worker assessment / plan:  CSW met with pt's daughter, Jonathan Valencia to discuss d/c planning. Pt has dementia and is oriented to self only. Pt moved to live with Jonathan Valencia in December from SNF in [place]. He fell at home and fractured his hip. Pt is post op day 1. Pt had been going daily to the adult center while Jonathan Valencia was working, but it was closed some due to bad weather and holidays. Awaiting PT evaluation, but anticipate need for SNF. CSW discussed placement process and confirmed that pt used 29 days at [facility] in [place]. She is aware that pt will not have a reset of days. Jonathan Valencia understands Medicare coverage/criteria for SNF and requests [facility] if possible.     Employment status:  Retired Forensic scientist:  Medicare PT Recommendations:  [place] / Referral to community resources:  [place]  Patient/Family's  Response to care:  Pt's daughter requests placement in [place] if possible.   Patient/Family's Understanding of and Emotional Response to Diagnosis, Current Treatment, and Prognosis:  Pt's daughter is aware of admission diagnosis and treatment plan. She was anticipating placement at SNF for rehab.   Emotional Assessment Appearance:  Appears stated age Attitude/Demeanor/Rapport:  Unable to Assess Affect (typically observed):  Unable to Assess Orientation:  Oriented to Self Alcohol / Substance use:  Not Applicable Psych involvement (Current and /or in the community):  No (Comment)  Discharge Needs  Concerns to be addressed:  Discharge Planning Concerns Readmission within the last 30 days:  No Current discharge risk:  Physical Impairment Barriers to Discharge:  Continued Medical Work up   Salome Arnt, [place] 08/10/2016, 10:29 AM [phone]

## 2016-08-10 NOTE — Addendum Note (Signed)
Addendum  created 08/10/16 1028 by Earleen NewportAmy A Mckay Brandt, CRNA   Sign clinical note

## 2016-08-10 NOTE — Evaluation (Signed)
Occupational Therapy Evaluation Patient Details Name: Jonathan Valencia MRN: [id number] DOB: [birth year] Today's Date: 08/10/2016    History of Present Illness 83 y.o. male with a past medical history dementia, hypertension, who lives with his daughter and moved to this area recently. Patient has had multiple falls over the last week or so. His daughter has been unable to care for him because he tends to wander in the nighttime due to his dementia. He was seen in the emergency department on 1/8 and then on 1/15. He was found to have a fracture involving the right greater trochanter. This was discussed with an orthopedic surgeon, Dr. Magnus IvanBlackman who felt that this was not a surgical fracture. Patient was supposed to get home health and daughter was supposed to pursue placement to SNF. However, this hasn't happened yet. Patient apparently fell twice in the last 24 hours.   In the emergency department, patient was found to have worsening displacement of his fracture. EDP discussed with orthopedic surgeon. CT scan of the hip was recommended. Daughter is unable to care for him. He is found to have low-grade fever. He was found to have hypotension initially.  Pt is now s/p internal fixation of R hip with short gamma nail.  WBAT.   Clinical Impression   Pt sitting up in chair on OT arrival, pleasant, states he has no pain at this time. Pt with dementia therefore history taken from chart review and daughters report to PT. Pt able to follow one-step commands and answer simple questions. Pt is currently max-total assist for ADL completion, therefore recommend SNF on discharge due to increased level of assistance required.  Will continue to follow while in acute care.     Follow Up Recommendations  SNF    Equipment Recommendations  None recommended by OT       Precautions / Restrictions Precautions Precautions: Fall Precaution Comments: 5 falls in the past 6 months.  Restrictions Weight Bearing  Restrictions: No RLE Weight Bearing: Weight bearing as tolerated      Mobility Bed Mobility Overal bed mobility: Needs Assistance Bed Mobility: Supine to Sit     Supine to sit: Max assist;HOB elevated     General bed mobility comments: Pt seated in chair on OT arrival  Transfers Overall transfer level: Needs assistance Equipment used: Rolling walker (2 wheeled) Transfers: Sit to/from BJ'sStand;Stand Pivot Transfers Sit to Stand: Max assist Stand pivot transfers: Max assist       General transfer comment: Pt seated in chair on OT arrival         ADL Overall ADL's : Needs assistance/impaired Eating/Feeding: Set up;Sitting;Cueing for sequencing Eating/Feeding Details (indicate cue type and reason): pt requires assistance for opening containers and placing straws in drink                                   General ADL Comments: Pt is currently max-total assist for all ADL completion due to pain and weakness. Verbal cuing and set-up required for seated feeding tasks     Vision Vision Assessment?: No apparent visual deficits          Pertinent Vitals/Pain Pain Assessment: No/denies pain Faces Pain Scale: Hurts whole lot Pain Location: R hip during mobility Pain Descriptors / Indicators: Grimacing Pain Intervention(s): Limited activity within patient's tolerance;RN gave pain meds during session;Monitored during session;Repositioned     Hand Dominance Right   Extremity/Trunk Assessment Upper Extremity Assessment  Upper Extremity Assessment: Generalized weakness (Pt with 50% ROM and 3/5 strength in BUE)   Lower Extremity Assessment Lower Extremity Assessment: Defer to PT evaluation       Communication Communication Communication: No difficulties   Cognition Arousal/Alertness: Awake/alert Behavior During Therapy: WFL for tasks assessed/performed Overall Cognitive Status: History of cognitive impairments - at baseline Area of Impairment:  Orientation Orientation Level: Time;Disoriented to                            Home Living Family/patient expects to be discharged to:: Skilled nursing facility Living Arrangements: Children (daughter-[name]) Available Help at Discharge: Available PRN/intermittently Type of Home: House Home Access: Stairs to enter [employer] of Steps: 3-4 steps   Home Layout: One level     Bathroom Shower/Tub: Chief Strategy Officer: Standard     Home Equipment: Cane - single point;Walker - 4 wheels   Additional Comments: Dtr states that he has fallen when using the Rollator.        Prior Functioning/Environment Level of Independence: Needs assistance  Gait / Transfers Assistance Needed: Pt uses cane during the day, but in the evening, he does not use anything.   ADL's / Homemaking Assistance Needed: Assistance for dressing, assistance for bathing - assistance getting into the shower.             OT Problem List: Decreased strength;Decreased activity tolerance;Impaired balance (sitting and/or standing);Decreased cognition;Decreased safety awareness;Decreased knowledge of use of DME or AE;Impaired UE functional use   OT Treatment/Interventions: Self-care/ADL training;Therapeutic exercise;DME and/or AE instruction;Therapeutic activities;Patient/family education    OT Goals(Current goals can be found in the care plan section) Acute Rehab OT Goals Patient Stated Goal: unable to state  OT Frequency: Min 2X/week    End of Session    Activity Tolerance: Patient tolerated treatment well Patient left: in chair;with call bell/phone within reach;with chair alarm set   Time: 1130-1146 OT Time Calculation (min): 16 min Charges:  OT General Charges $OT Visit: 1 Procedure OT Evaluation $OT Eval Low Complexity: 1 Procedure  Ezra Sites, OTR/L  [phone] 08/10/2016, 11:51 AM

## 2016-08-10 NOTE — Evaluation (Signed)
Physical Therapy Evaluation Patient Details Name: Jonathan Valencia MRN: [id number] DOB: 03-Jun-1927 Today's Date: 08/10/2016   History of Present Illness  83 y.o. male with a past medical history dementia, hypertension, who lives with his daughter and moved to this area recently. Patient has had multiple falls over the last week or so. His daughter has been unable to care for him because he tends to wander in the nighttime due to his dementia. He was seen in the emergency department on 1/8 and then on 1/15. He was found to have a fracture involving the right greater trochanter. This was discussed with an orthopedic surgeon, Dr. Magnus Ivan who felt that this was not a surgical fracture. Patient was supposed to get home health and daughter was supposed to pursue placement to SNF. However, this hasn't happened yet. Patient apparently fell twice in the last 24 hours.   In the emergency department, patient was found to have worsening displacement of his fracture. EDP discussed with orthopedic surgeon. CT scan of the hip was recommended. Daughter is unable to care for him. He is found to have low-grade fever. He was found to have hypotension initially.  Pt is now s/p internal fixation of R hip with short gamma nail.  WBAT.    Clinical Impression  Pt received in bed, Dtr [name] arrived in the room, and was able to provide all home set up and PLOF information.  Prior to admission, pt was using a cane for ambulation during the day when [name] was at work, and then he does not use any device in the evenings.  [name] reports that he was requiring assistance for both dressing and bathing.  During PT evaluation he was lethargic and sleepy, but oriented x 3.  He required Max A for supine<>sit, and Max A for sit<>stand and SPT bed<>chair with RW.  Pt has difficulty with full extension of B knees, as requires cues to place weight through his UE's.  He is able to perform when cued, but does not maintain.  At this point, he is  recommended for SNF due to multiple falls, and need for increased level of assistance for all functional mobility tasks at this time.      Follow Up Recommendations SNF    Equipment Recommendations  None recommended by PT    Recommendations for Other Services       Precautions / Restrictions Precautions Precautions: Fall Precaution Comments: 5 falls in the past 6 months.  Restrictions Weight Bearing Restrictions: No RLE Weight Bearing: Weight bearing as tolerated      Mobility  Bed Mobility Overal bed mobility: Needs Assistance Bed Mobility: Supine to Sit     Supine to sit: Max assist;HOB elevated        Transfers Overall transfer level: Needs assistance Equipment used: Rolling walker (2 wheeled) Transfers: Sit to/from [employer] Sit to Stand: Max assist Stand pivot transfers: Max assist       General transfer comment: Pt was able to take a few steps to transfer from the bed<>chair.  Maxi move sling left under the patient in the chair.   Ambulation/Gait                Stairs            Wheelchair Mobility    Modified Rankin (Stroke Patients Only)       Balance Overall balance assessment: History of Falls;Needs assistance Sitting-balance support: Bilateral upper extremity supported;Feet supported Sitting balance-Leahy Scale: Fair  Standing balance support: Bilateral upper extremity supported Standing balance-Leahy Scale: Zero                               Pertinent Vitals/Pain Pain Assessment: Faces Faces Pain Scale: Hurts whole lot Pain Location: R hip during mobility Pain Descriptors / Indicators: Grimacing Pain Intervention(s): Limited activity within patient's tolerance;RN gave pain meds during session;Monitored during session;Repositioned    Home Living   Living Arrangements: Children [place]) Available Help at Discharge: Available PRN/intermittently Type of Home: House Home Access: Stairs to  enter   [employer] of Steps: 3-4 steps Home Layout: One level Home Equipment: Cane - single point;Walker - 4 wheels Additional Comments: Dtr states that he has fallen when using the Rollator.      Prior Function Level of Independence: Independent with assistive device(s)   Gait / Transfers Assistance Needed: Pt uses cane during the day, but in the evening, he does not use anything.    ADL's / Homemaking Assistance Needed: Assistance for dressing, assistance for bathing - assistance getting into the shower.         Hand Dominance        Extremity/Trunk Assessment   Upper Extremity Assessment Upper Extremity Assessment: Generalized weakness    Lower Extremity Assessment Lower Extremity Assessment: Generalized weakness       Communication      Cognition Arousal/Alertness: Awake/alert Behavior During Therapy: WFL for tasks assessed/performed Overall Cognitive Status: History of cognitive impairments - at baseline Area of Impairment: Orientation Orientation Level: Time;Disoriented to                  General Comments      Exercises Total Joint Exercises Ankle Circles/Pumps: AROM;Both;5 reps;Supine Heel Slides: AAROM;Both;5 reps;Supine;Limitations Heel Slides Limitations: Pt falling asleep during attempts at exercises.    Assessment/Plan    PT Assessment Patient needs continued PT services  PT Problem List Decreased strength;Decreased activity tolerance;Decreased balance;Decreased mobility;Decreased cognition;Decreased knowledge of use of DME;Decreased safety awareness;Decreased knowledge of precautions;Pain          PT Treatment Interventions DME instruction;Gait training;Functional mobility training;Therapeutic activities;Therapeutic exercise;Balance training;Cognitive remediation;Patient/family education    PT Goals (Current goals can be found in the Care Plan section)  Acute Rehab PT Goals Patient Stated Goal: unable to state PT Goal  Formulation: Patient unable to participate in goal setting Time For Goal Achievement: 08/17/16 Potential to Achieve Goals: Fair    Frequency 7X/week   Barriers to discharge Decreased caregiver support Pt lives with dtr who works during the day, and is unable to provide 24/7 supervision/assistance.     Co-evaluation               End of Session Equipment Utilized During Treatment: Gait belt Activity Tolerance: Patient limited by pain Patient left: in chair;with call bell/phone within reach Nurse Communication: Mobility status;Need for lift equipment Nita Sells, RN notified of pt's mobility status, and need to use Maxi move for transfers. )    Functional Assessment Tool Used: [employer] AM-PAC "6-clicks"  Functional Limitation: Mobility: Walking and moving around Mobility: Walking and Moving Around Current Status [phone]): At least 80 percent but less than 100 percent impaired, limited or restricted Mobility: Walking and Moving Around Goal Status [phone]): At least 60 percent but less than 80 percent impaired, limited or restricted    Time: 0981-1914 PT Time Calculation (min) (ACUTE ONLY): 25 min   Charges:   PT Evaluation $PT  Eval Low Complexity: 1 Procedure PT Treatments $Therapeutic Activity: 8-22 mins   PT G Codes:   PT G-Codes **NOT FOR INPATIENT CLASS** Functional Assessment Tool Used: [employer] AM-PAC "6-clicks"  Functional Limitation: Mobility: Walking and moving around Mobility: Walking and Moving Around Current Status [phone]([phone]): At least 80 percent but less than 100 percent impaired, limited or restricted Mobility: Walking and Moving Around Goal Status [phone](G8979): At least 60 percent but less than 80 percent impaired, limited or restricted    Beth Deziah Renwick, PT, DPT X: [phone]

## 2016-08-10 NOTE — Anesthesia Postprocedure Evaluation (Signed)
Anesthesia Post Note  Patient: Jonathan HugerLawrence Valencia  Procedure(s) Performed: Procedure(s) (LRB): INTERNAL FIXATION RIGHT HIP (Right)  Patient location during evaluation: Nursing Unit Anesthesia Type: Spinal Level of consciousness: awake and alert (Patient at baseline; has dementia) Pain management: pain level controlled (Patient appears comfortable.) Vital Signs Assessment: post-procedure vital signs reviewed and stable Respiratory status: spontaneous breathing Cardiovascular status: stable Postop Assessment: no signs of nausea or vomiting and no backache Anesthetic complications: no     Last Vitals:  Vitals:   08/10/16 0538 08/10/16 0939  BP: 137/81 (!) 109/56  Pulse: 89 78  Resp: 20 18  Temp: 36.8 C 36.6 C    Last Pain:  Vitals:   08/10/16 1026  TempSrc:   PainSc: 5                  Esha Fincher A

## 2016-08-10 NOTE — Clinical Social Work Placement (Signed)
   CLINICAL SOCIAL WORK PLACEMENT  NOTE  Date:  08/10/2016  Patient Details  Name: Jonathan Valencia MRN: [id number] Date of Birth: [birth year]  Clinical Social Work is seeking post-discharge placement for this patient at the Skilled  Nursing Facility level of care (*CSW will initial, date and re-position this form in  chart as items are completed):  Yes   Patient/family provided with [facility] Clinical Social Work Department's list of facilities offering this level of care within the geographic area requested by the patient (or if unable, by the patient's family).  Yes   Patient/family informed of their freedom to choose among providers that offer the needed level of care, that participate in Medicare, Medicaid or managed care program needed by the patient, have an available bed and are willing to accept the patient.  Yes   Patient/family informed of [employer]'s ownership interest in [facility] and [facility], as well as of the fact that they are under no obligation to receive care at these facilities.  PASRR submitted to EDS on 08/10/16     PASRR number received on 08/10/16     Existing PASRR number confirmed on       FL2 transmitted to all facilities in geographic area requested by pt/family on 08/10/16     FL2 transmitted to all facilities within larger geographic area on       Patient informed that his/her managed care company has contracts with or will negotiate with certain facilities, including the following:            Patient/family informed of bed offers received.  Patient chooses bed at       Physician recommends and patient chooses bed at      Patient to be transferred to   on  .  Patient to be transferred to facility by       Patient family notified on   of transfer.  Name of family member notified:        PHYSICIAN       Additional Comment:    _______________________________________________ Karn CassisStultz, Addeline Calarco [place], LCSW 08/10/2016, 10:24  AM [phone]

## 2016-08-10 NOTE — NC FL2 (Signed)
[place] MEDICAID FL2 LEVEL OF CARE SCREENING TOOL     IDENTIFICATION  Patient Name: Jonathan Valencia Birthdate: July 16, 1927 Sex: male Admission Date (Current Location): 08/07/2016  [facility] [place]   Facility and Address:  [facility],  [address], [place] [id number]      Provider Number: [id number]  Attending Physician Name and Address:  Erick BlinksJehanzeb Memon, MD  Relative Name and Phone Number:       Current Level of Care: Hospital Recommended Level of Care: Skilled Nursing Facility Prior Approval Number:    Date Approved/Denied:   PASRR Number: [id number]  Discharge Plan: SNF    Current Diagnoses: Patient Active Problem List   Diagnosis Date Noted  . Closed intertrochanteric fracture, right, initial encounter (HCC)   . Hip fracture (HCC) 08/08/2016  . Fracture of greater trochanter of right femur (HCC) 08/07/2016  . Dementia 08/07/2016  . Falls frequently 08/07/2016  . Right hip pain 08/07/2016    Orientation RESPIRATION BLADDER Height & Weight     Self  Normal Indwelling catheter Weight: 109 lb (49.4 kg) Height:  5' (152.4 cm)  BEHAVIORAL SYMPTOMS/MOOD NEUROLOGICAL BOWEL NUTRITION STATUS  Other (Comment) (none)  (n/a) Incontinent Diet (Regular)  AMBULATORY STATUS COMMUNICATION OF NEEDS Skin   Extensive Assist Verbally Surgical wounds                       Personal Care Assistance Level of Assistance  Bathing, Feeding Bathing Assistance: Maximum assistance Feeding assistance: Maximum assistance Dressing Assistance: Maximum assistance     Functional Limitations Info  Sight, Hearing, Speech Sight Info: Adequate Hearing Info: Adequate Speech Info: Adequate    SPECIAL CARE FACTORS FREQUENCY  PT (By licensed PT)                    Contractures      Additional Factors Info  Psychotropic Code Status Info: Full code Allergies Info: No known allergies Psychotropic Info: Depakote sprinkle, Trazodone          Current Medications (08/10/2016):  This is the current hospital active medication list Current Facility-Administered Medications  Medication Dose Route Frequency Provider Last Rate Last Dose  . 0.9 %  sodium chloride infusion  250 mL Intravenous PRN Osvaldo ShipperGokul Krishnan, MD      . 0.9 %  sodium chloride infusion   Intravenous Once Vickki HearingStanley E Harrison, MD      . 0.9 %  sodium chloride infusion   Intravenous Continuous Erick BlinksJehanzeb Memon, MD      . acetaminophen (TYLENOL) tablet 650 mg  650 mg Oral Q6H PRN Osvaldo ShipperGokul Krishnan, MD       Or  . acetaminophen (TYLENOL) suppository 650 mg  650 mg Rectal Q6H PRN Osvaldo ShipperGokul Krishnan, MD      . albuterol (PROVENTIL) (2.5 MG/3ML) 0.083% nebulizer solution 2.5 mg  2.5 mg Nebulization Q2H PRN Osvaldo ShipperGokul Krishnan, MD      . aspirin EC tablet 325 mg  325 mg Oral Q breakfast Vickki HearingStanley E Harrison, MD   325 mg at 08/10/16 0848  . divalproex (DEPAKOTE SPRINKLE) capsule 125 mg  125 mg Oral Q1500 Osvaldo ShipperGokul Krishnan, MD   125 mg at 08/08/16 1455  . donepezil (ARICEPT) tablet 10 mg  10 mg Oral Daily Osvaldo ShipperGokul Krishnan, MD   10 mg at 08/10/16 0848  . HYDROcodone-acetaminophen (NORCO/VICODIN) 5-325 MG per tablet 1 tablet  1 tablet Oral Q6H PRN Osvaldo ShipperGokul Krishnan, MD   1 tablet at 08/08/16 1617  .  menthol-cetylpyridinium (CEPACOL) lozenge 3 mg  1 lozenge Oral PRN Vickki Hearing, MD       Or  . phenol (CHLORASEPTIC) mouth spray 1 spray  1 spray Mouth/Throat PRN Vickki Hearing, MD      . metoCLOPramide (REGLAN) tablet 5-10 mg  5-10 mg Oral Q8H PRN Vickki Hearing, MD       Or  . metoCLOPramide (REGLAN) injection 5-10 mg  5-10 mg Intravenous Q8H PRN Vickki Hearing, MD      . ondansetron [facility]) tablet 4 mg  4 mg Oral Q6H PRN Vickki Hearing, MD       Or  . ondansetron [facility]) injection 4 mg  4 mg Intravenous Q6H PRN Vickki Hearing, MD      . sodium chloride flush (NS) 0.9 % injection 3 mL  3 mL Intravenous Q12H Osvaldo Shipper, MD   3 mL at 08/10/16 0848  . sodium chloride  flush (NS) 0.9 % injection 3 mL  3 mL Intravenous PRN Osvaldo Shipper, MD      . traZODone (DESYREL) tablet 25 mg  25 mg Oral QHS Osvaldo Shipper, MD   25 mg at 08/09/16 2219     Discharge Medications: Please see discharge summary for a list of discharge medications.  Relevant Imaging Results:  Relevant Lab Results:   Additional Information SSN: [id number]  Karn Cass[place], [place] [phone]

## 2016-08-11 ENCOUNTER — Encounter (HOSPITAL_COMMUNITY): Payer: Self-pay | Admitting: Orthopedic Surgery

## 2016-08-11 ENCOUNTER — Other Ambulatory Visit: Payer: Self-pay | Admitting: *Deleted

## 2016-08-11 ENCOUNTER — Telehealth: Payer: Self-pay | Admitting: Orthopedic Surgery

## 2016-08-11 DIAGNOSIS — S72144D Nondisplaced intertrochanteric fracture of right femur, subsequent encounter for closed fracture with routine healing: Secondary | ICD-10-CM

## 2016-08-11 DIAGNOSIS — I10 Essential (primary) hypertension: Secondary | ICD-10-CM

## 2016-08-11 LAB — BASIC METABOLIC PANEL
Anion gap: 6 (ref 5–15)
BUN: 16 mg/dL (ref 6–20)
CALCIUM: 8.5 mg/dL — AB (ref 8.9–10.3)
CO2: 27 mmol/L (ref 22–32)
CREATININE: 0.6 mg/dL — AB (ref 0.61–1.24)
Chloride: 100 mmol/L — ABNORMAL LOW (ref 101–111)
GFR calc non Af Amer: >60 mL/min (ref >60)
Glucose, Bld: 109 mg/dL — ABNORMAL HIGH (ref 65–99)
Potassium: 3.6 mmol/L (ref 3.5–5.1)
SODIUM: 133 mmol/L — AB (ref 135–145)

## 2016-08-11 LAB — CBC
HCT: 32.1 % — ABNORMAL LOW (ref 39.0–52.0)
HEMOGLOBIN: 11.1 g/dL — AB (ref 13.0–17.0)
MCH: 30.8 pg (ref 26.0–34.0)
MCHC: 34.6 g/dL (ref 30.0–36.0)
MCV: 89.2 fL (ref 78.0–100.0)
Platelets: 335 10*3/uL (ref 150–400)
RBC: 3.6 MIL/uL — ABNORMAL LOW (ref 4.22–5.81)
RDW: 13.2 % (ref 11.5–15.5)
WBC: 10.8 10*3/uL — ABNORMAL HIGH (ref 4.0–10.5)

## 2016-08-11 MED ORDER — ASPIRIN 325 MG PO TBEC: 325.0000 mg | DELAYED_RELEASE_TABLET | Freq: Every day | ORAL | 0 refills | Status: DC

## 2016-08-11 MED ORDER — MAGNESIUM CITRATE PO SOLN
1.0000 | Freq: Once | ORAL | Status: AC
  Administered 2016-08-11: 1 via ORAL
  Filled 2016-08-11: qty 296

## 2016-08-11 MED ORDER — HYDROCODONE-ACETAMINOPHEN 5-325 MG PO TABS: 1.0000 | ORAL_TABLET | Freq: Four times a day (QID) | ORAL | 0 refills | Status: DC | PRN

## 2016-08-11 NOTE — Discharge Summary (Signed)
Physician Discharge Summary  Aakash Hollomon ZOX:[id number] DOB: 20-Sep-1926 DOA: 08/07/2016  PCP: PROVIDER NOT IN SYSTEM  Admit date: 08/07/2016 Discharge date: 08/11/2016  Admitted From: home Disposition:  SNF  Recommendations for Outpatient Follow-up:  1. Follow up with PCP in 1-2 weeks 2. Please obtain BMP/CBC in one week 3. Weight bearing as tolerated 4. Xray of right hip in 2 weeks 5. Follow up with Dr. Romeo Apple in 2 weeks 6. Full dose aspirin for 28 days for DVT prophylaxis 7. Remove staples 2/6  Home Health: Equipment/Devices:  Discharge Condition: stable CODE STATUS: full code Diet recommendation: Heart Healthy  Brief/Interim Summary: 83 yo with mechanical fall, hx of dementia, lives at home with daughter, admitted after fall with right hip fracture. Dr Romeo Apple of orthopedics had seen him and planned ORIF on 1/23 . he underwent surgery without any immediate complications. Plan is to discharge to skilled nursing facility when stable for physical therapy.  Discharge Diagnoses:  Principal Problem:   Fracture of greater trochanter of right femur (HCC) Active Problems:   Dementia   Falls frequently   Right hip pain   Hip fracture (HCC)   Closed intertrochanteric fracture, right, initial encounter (HCC)  1. Right Hip Fx:  patient underwent ORIF on 1/23 without any immediate complications. His pain appears to be managed. He was seen by physical therapy with recommendations for SNF placement. He will follow up with Dr. Romeo Apple in 2 weeks. He is weight bearing as tolerated. 2. HTN: BP is controlled. Restart quinapril on discharge 3. Dementia: He has situational awareness, and able to converse meaningfully.  Discharge Instructions  Discharge Instructions    Diet - low sodium heart healthy    Complete by:  As directed    Increase activity slowly    Complete by:  As directed      Allergies as of 08/11/2016   No Known Allergies     Medication List    TAKE  these medications   aspirin 325 MG EC tablet Take 1 tablet (325 mg total) by mouth daily with breakfast. Start taking on:  08/12/2016 What changed:  medication strength  how much to take  when to take this   divalproex 125 MG capsule Commonly known as:  DEPAKOTE SPRINKLE Take 125 mg by mouth daily. Take at 4 pm.   donepezil 10 MG tablet Commonly known as:  ARICEPT Take 10 mg by mouth daily.   HYDROcodone-acetaminophen 5-325 MG tablet Commonly known as:  NORCO Take 1-2 tablets by mouth every 6 (six) hours as needed for moderate pain.   quinapril 40 MG tablet Commonly known as:  ACCUPRIL Take 40 mg by mouth daily.   traZODone 50 MG tablet Commonly known as:  DESYREL Take 25 mg by mouth at bedtime.       No Known Allergies  Consultations:  orthopedics   Procedures/Studies: Dg Chest 1 View  Result Date: 08/07/2016 CLINICAL DATA:  Initial evaluation for acute trauma, fall. EXAM: CHEST 1 VIEW COMPARISON:  None available. FINDINGS: Transverse heart size at the upper limits of normal. Mediastinal silhouette within normal limits. Lungs mildly hypoinflated. Chronic coarsening of the interstitial markings present. No focal infiltrates. No pulmonary edema or pleural effusion. No pneumothorax. Calcified granuloma overlies the mid left lung. No acute osseous abnormality.  Bones are diffusely demineralized. IMPRESSION: Chronic coarsening of the interstitial markings with no active cardiopulmonary disease identified. Electronically Signed   By: Rise Mu M.D.   On: 08/07/2016 16:59   Dg Shoulder Right  Result  Date: 08/01/2016 CLINICAL DATA:  Right hip and right shoulder pain.  Fall. EXAM: RIGHT SHOULDER - 2+ VIEW COMPARISON:  No recent prior . FINDINGS: Acromioclavicular glenohumeral degenerative change. No evidence of fracture dislocation. Diffuse osteopenia . IMPRESSION: Diffuse osteopenia degenerative change. No acute abnormality identified. Electronically Signed   By:  Maisie Fus  Register   On: 08/01/2016 10:04   Ct Head Wo Contrast  Result Date: 08/01/2016 CLINICAL DATA:  The patient fell twice last night.  Head trauma. EXAM: CT HEAD WITHOUT CONTRAST CT CERVICAL SPINE WITHOUT CONTRAST TECHNIQUE: Multidetector CT imaging of the head and cervical spine was performed following the standard protocol without intravenous contrast. Multiplanar CT image reconstructions of the cervical spine were also generated. COMPARISON:  None. CT scan dated 07/25/2016 FINDINGS: CT HEAD FINDINGS Brain: No evidence of acute infarction, hemorrhage, hydrocephalus, extra-axial collection or mass lesion/mass effect. Diffuse slight atrophy with secondary ventricular dilatation. Vascular: No hyperdense vessel or unexpected calcification. Skull: Normal. Negative for fracture or focal lesion. Sinuses/Orbits: Normal. CT CERVICAL SPINE FINDINGS Alignment: Slight anterolisthesis at C3-4 and C7-T1 due to facet arthritis at each of those levels. Skull base and vertebrae: No acute fracture. Soft tissues and spinal canal: No prevertebral fluid or swelling. No visible canal hematoma. Disc levels: Disc space narrowing at C5-6 and C6-7 without focal disc protrusion. Severe bilateral facet arthritis at C3-4. Auto fusion of the left facet joint at C5-6. Severe degenerative changes between the odontoid in the anterior arch of C1. Upper chest: Negative. Other: None IMPRESSION: 1. No acute intracranial abnormality.  Diffuse mild atrophy. 2. No acute abnormality of the cervical spine. Multilevel degenerative disc and joint disease as described. Electronically Signed   By: Francene Boyers M.D.   On: 08/01/2016 10:40   Ct Head Wo Contrast  Result Date: 07/25/2016 CLINICAL DATA:  Head injury after fall.  No loss of consciousness. EXAM: CT HEAD WITHOUT CONTRAST TECHNIQUE: Contiguous axial images were obtained from the base of the skull through the vertex without intravenous contrast. COMPARISON:  None. FINDINGS: Brain: Minimal  diffuse cortical atrophy is noted. No mass effect or midline shift is noted. Ventricular size is within normal limits. There is no evidence of mass lesion, hemorrhage or acute infarction. Vascular: No hyperdense vessel or unexpected calcification. Skull: Normal. Negative for fracture or focal lesion. Sinuses/Orbits: No acute finding. Other: None. IMPRESSION: Minimal diffuse cortical atrophy. No acute intracranial abnormality seen. Electronically Signed   By: Lupita Raider, M.D.   On: 07/25/2016 13:00   Ct Cervical Spine Wo Contrast  Result Date: 08/01/2016 CLINICAL DATA:  The patient fell twice last night.  Head trauma. EXAM: CT HEAD WITHOUT CONTRAST CT CERVICAL SPINE WITHOUT CONTRAST TECHNIQUE: Multidetector CT imaging of the head and cervical spine was performed following the standard protocol without intravenous contrast. Multiplanar CT image reconstructions of the cervical spine were also generated. COMPARISON:  None. CT scan dated 07/25/2016 FINDINGS: CT HEAD FINDINGS Brain: No evidence of acute infarction, hemorrhage, hydrocephalus, extra-axial collection or mass lesion/mass effect. Diffuse slight atrophy with secondary ventricular dilatation. Vascular: No hyperdense vessel or unexpected calcification. Skull: Normal. Negative for fracture or focal lesion. Sinuses/Orbits: Normal. CT CERVICAL SPINE FINDINGS Alignment: Slight anterolisthesis at C3-4 and C7-T1 due to facet arthritis at each of those levels. Skull base and vertebrae: No acute fracture. Soft tissues and spinal canal: No prevertebral fluid or swelling. No visible canal hematoma. Disc levels: Disc space narrowing at C5-6 and C6-7 without focal disc protrusion. Severe bilateral facet arthritis at C3-4. Auto fusion  of the left facet joint at C5-6. Severe degenerative changes between the odontoid in the anterior arch of C1. Upper chest: Negative. Other: None IMPRESSION: 1. No acute intracranial abnormality.  Diffuse mild atrophy. 2. No acute  abnormality of the cervical spine. Multilevel degenerative disc and joint disease as described. Electronically Signed   By: Francene BoyersJames  Maxwell M.D.   On: 08/01/2016 10:40   Ct Hip Right Wo Contrast  Result Date: 08/07/2016 CLINICAL DATA:  Right hip fracture EXAM: CT OF THE RIGHT HIP WITHOUT CONTRAST TECHNIQUE: Multidetector CT imaging of the right hip was performed according to the standard protocol. Multiplanar CT image reconstructions were also generated. COMPARISON:  08/07/2016 and 08/01/2016 radiographs of the right hip FINDINGS: Bones/Joint/Cartilage Comminuted varus angulated intratrochanteric fracture of the right femur is noted with fracture undermining the greater trochanter as well. Joint space narrowing with subchondral degenerate cystic lucencies of the acetabular roof superolaterally. No right-sided pubic rami fracture nor right acetabular fracture noted. Joint space narrowing along the weight-bearing portion of the right hip consistent with cartilaginous thinning and osteoarthritis. Small to moderate posttraumatic joint effusion. Ligaments Not well visualized by CT. Muscles and Tendons No intramuscular hemorrhage.  No focal soft tissue mass. Soft tissues Periarticular soft tissue swelling and edema secondary to fracture. IMPRESSION: Acute, closed, comminuted varus angulated intertrochanteric fracture of the right femur. Fracture undermining the greater trochanter as well. Small to moderate joint effusion. No acetabular fracture. No malalignment of the hip joint. Electronically Signed   By: Tollie Ethavid  Kwon M.D.   On: 08/07/2016 19:41   Dg Hip Operative Unilat With Pelvis Right  Result Date: 08/09/2016 CLINICAL DATA:  Intramedullary right hip nailing EXAM: OPERATIVE RIGHT HIP (WITH PELVIS IF PERFORMED) 8 VIEWS TECHNIQUE: Fluoroscopic spot image(s) were submitted for interpretation post-operatively. COMPARISON:  None. FINDINGS: Multiple intraoperative spot images demonstrate internal fixation across the  right proximal femur. No visible hardware complicating feature. IMPRESSION: Right proximal femoral internal fixation. No visible complicating feature. Electronically Signed   By: Charlett NoseKevin  Dover M.D.   On: 08/09/2016 14:00   Dg Hip Unilat With Pelvis 2-3 Views Right  Result Date: 08/07/2016 CLINICAL DATA:  Initial evaluation for acute trauma, fall last week. Acute right hip pain. EXAM: DG HIP (WITH OR WITHOUT PELVIS) 2-3V RIGHT COMPARISON:  Prior radiograph from 08/01/2016. FINDINGS: Previously identified fracture involving the right greater trochanter again seen. There is new extension through the intertrochanteric right femur, with new 7-8 mm of displacement. Right femoral shaft is subluxed superiorly. Femoral head remains normally aligned within the acetabulum. No definite acetabular fracture. Femoral head height is preserved. Remainder of the visualized bony pelvis is grossly intact. No acute injury about the left hip identified. Degenerative changes noted within the lower lumbar spine. Diffuse osteopenia noted. Soft tissue swelling overlies the right hip. IMPRESSION: Recently identified fracture involving the greater trochanter of the right femur now demonstrates intertrochanteric extension with new 7-8 mm of displacement. Electronically Signed   By: Rise MuBenjamin  McClintock M.D.   On: 08/07/2016 16:58   Dg Hip Unilat W Or Wo Pelvis 2-3 Views Right  Result Date: 08/01/2016 CLINICAL DATA:  Right hip pain after fall.  Subsequent encounter EXAM: DG HIP (WITH OR WITHOUT PELVIS) 2-3V RIGHT COMPARISON:  07/25/2014 FINDINGS: Nondisplaced right greater trochanter fracture has a stable appearance since prior. No visible callus. No new or progressed fracture line. Both hips are located. Osteopenia and atherosclerosis. IMPRESSION: Unchanged appearance of recent right greater trochanter fracture that is nondisplaced. Electronically Signed   By: Marja KaysJonathon  Watts M.D.   On: 08/01/2016 10:05   Dg Hip Unilat W Or Wo Pelvis  2-3 Views Right  Result Date: 07/25/2016 CLINICAL DATA:  Right hip pain secondary to a fall this morning EXAM: DG HIP (WITH OR WITHOUT PELVIS) 2-3V RIGHT COMPARISON:  None FINDINGS: There is a slightly comminuted fracture of the tip of the right greater trochanter. Minimal distraction. Femoral neck is intact. Pelvic bones are intact. IMPRESSION: Comminuted fracture of the tip of the right greater trochanter. Electronically Signed   By: Francene Boyers M.D.   On: 07/25/2016 13:10    INTERNAL FIXATION RIGHT HIP (Right) with short gamma nail, 125, 95 lag screw, proximal set screw and sliding mode and a 40 mm distal locking bolt   Subjective: No complaints of pain, feels good  Discharge Exam: Vitals:   08/10/16 2148 08/11/16 0631  BP: 105/67 114/67  Pulse: 73 79  Resp: 20 18  Temp: 98.5 F (36.9 C) 98.5 F (36.9 C)   Vitals:   08/10/16 1338 08/10/16 1748 08/10/16 2148 08/11/16 0631  BP: 114/73 110/65 105/67 114/67  Pulse: 85 77 73 79  Resp: 18 20 20 18   Temp: 97.8 F (36.6 C) 98.8 F (37.1 C) 98.5 F (36.9 C) 98.5 F (36.9 C)  TempSrc: Oral Oral Oral Oral  SpO2: 98% 97% 98% 98%  Weight:      Height:        General: Pt is alert, awake, not in acute distress Cardiovascular: RRR, S1/S2 +, no rubs, no gallops Respiratory: CTA bilaterally, no wheezing, no rhonchi Abdominal: Soft, NT, ND, bowel sounds + Extremities: no edema, no cyanosis    The results of significant diagnostics from this hospitalization (including imaging, microbiology, ancillary and laboratory) are listed below for reference.     Microbiology: Recent Results (from the past 240 hour(s))  Surgical pcr screen     Status: None   Collection Time: 08/08/16  8:51 AM  Result Value Ref Range Status   MRSA, PCR NEGATIVE NEGATIVE Final   Staphylococcus aureus NEGATIVE NEGATIVE Final    Comment:        The Xpert SA Assay (FDA approved for NASAL specimens in patients over 9 years of age), is one component of a  comprehensive surveillance program.  Test performance has been validated by [facility] for patients greater than or equal to 76 year old. It is not intended to diagnose infection nor to guide or monitor treatment.      Labs: BNP (last 3 results) No results for input(s): BNP in the last 8760 hours. Basic Metabolic Panel:  Recent Labs Lab 08/07/16 1517 08/08/16 0619 08/10/16 0406 08/11/16 0616  NA 137 137 133* 133*  K 3.6 3.6 3.7 3.6  CL 102 100* 99* 100*  CO2 23 27 25 27   GLUCOSE 191* 137* 151* 109*  BUN 22* 14 16 16   CREATININE 1.00 0.66 0.69 0.60*  CALCIUM 9.4 9.3 8.9 8.5*   Liver Function Tests:  Recent Labs Lab 08/08/16 0619  AST 30  ALT 19  ALKPHOS 107  BILITOT 2.5*  PROT 6.5  ALBUMIN 3.4*   No results for input(s): LIPASE, AMYLASE in the last 168 hours. No results for input(s): AMMONIA in the last 168 hours. CBC:  Recent Labs Lab 08/07/16 1517 08/08/16 0619 08/10/16 0406 08/11/16 0616  WBC 15.7* 14.2* 16.8* 10.8*  NEUTROABS 13.1*  --   --   --   HGB 12.9* 13.7 12.7* 11.1*  HCT 37.9* 40.7 37.5* 32.1*  MCV  88.8 88.9 88.4 89.2  PLT 416* 336 376 335   Cardiac Enzymes: No results for input(s): CKTOTAL, CKMB, CKMBINDEX, TROPONINI in the last 168 hours. BNP: Invalid input(s): POCBNP CBG: No results for input(s): GLUCAP in the last 168 hours. D-Dimer No results for input(s): DDIMER in the last 72 hours. Hgb A1c No results for input(s): HGBA1C in the last 72 hours. Lipid Profile No results for input(s): CHOL, HDL, LDLCALC, TRIG, CHOLHDL, LDLDIRECT in the last 72 hours. Thyroid function studies No results for input(s): TSH, T4TOTAL, T3FREE, THYROIDAB in the last 72 hours.  Invalid input(s): FREET3 Anemia work up No results for input(s): VITAMINB12, FOLATE, FERRITIN, TIBC, IRON, RETICCTPCT in the last 72 hours. Urinalysis    Component Value Date/Time   COLORURINE YELLOW 08/07/2016 1642   APPEARANCEUR CLEAR 08/07/2016 1642   LABSPEC 1.014  08/07/2016 1642   PHURINE 7.0 08/07/2016 1642   GLUCOSEU NEGATIVE 08/07/2016 1642   HGBUR NEGATIVE 08/07/2016 1642   BILIRUBINUR NEGATIVE 08/07/2016 1642   KETONESUR NEGATIVE 08/07/2016 1642   PROTEINUR NEGATIVE 08/07/2016 1642   NITRITE NEGATIVE 08/07/2016 1642   LEUKOCYTESUR NEGATIVE 08/07/2016 1642   Sepsis Labs Invalid input(s): PROCALCITONIN,  WBC,  LACTICIDVEN Microbiology Recent Results (from the past 240 hour(s))  Surgical pcr screen     Status: None   Collection Time: 08/08/16  8:51 AM  Result Value Ref Range Status   MRSA, PCR NEGATIVE NEGATIVE Final   Staphylococcus aureus NEGATIVE NEGATIVE Final    Comment:        The Xpert SA Assay (FDA approved for NASAL specimens in patients over 10 years of age), is one component of a comprehensive surveillance program.  Test performance has been validated by [facility] for patients greater than or equal to 47 year old. It is not intended to diagnose infection nor to guide or monitor treatment.      Time coordinating discharge: Over 30 minutes  SIGNED:   Erick Blinks, MD  [facility] 08/11/2016, 9:29 AM Pager   If 7PM-7AM, please contact night-coverage [email or web address] Password [email or web address]

## 2016-08-11 NOTE — Progress Notes (Addendum)
Patient ID: Jonathan Valencia, male   DOB: 1927-05-19, 83 y.o.   MRN: [id number] BP 114/67 (BP Location: Left Arm)   Pulse 79   Temp 98.5 F (36.9 C) (Oral)   Resp 18   Ht 5' (1.524 m)   Wt 109 lb (49.4 kg)   SpO2 98%   BMI 21.29 kg/m   CBC Latest Ref Rng & Units 08/11/2016 08/10/2016 08/08/2016  WBC 4.0 - 10.5 K/uL 10.8(H) 16.8(H) 14.2(H)  Hemoglobin 13.0 - 17.0 g/dL 11.1(L) 12.7(L) 13.7  Hematocrit 39.0 - 52.0 % 32.1(L) 37.5(L) 40.7  Platelets 150 - 400 K/uL 335 376 336   BMP Latest Ref Rng & Units 08/11/2016 08/10/2016 08/08/2016  Glucose 65 - 99 mg/dL 409(W109(H) 119(J151(H) 478(G137(H)  BUN 6 - 20 mg/dL 16 16 14   Creatinine 0.61 - 1.24 mg/dL 9.56(O0.60(L) 1.300.69 8.650.66  Sodium 135 - 145 mmol/L 133(L) 133(L) 137  Potassium 3.5 - 5.1 mmol/L 3.6 3.7 3.6  Chloride 101 - 111 mmol/L 100(L) 99(L) 100(L)  CO2 22 - 32 mmol/L 27 25 27   Calcium 8.9 - 10.3 mg/dL 7.8(I8.5(L) 8.9 9.3    Gait wbat   Discharge per hospitalist Hip fracture instructions for ORIF  Procedure open treatment internal fixation with  im short gamma nail  Weightbearing status as tolerated  Staples removed postop day #  12  Followup visit. Postop day # 14 or so  X-rays please have x-rays performed. Postop day 14 DVT prophylaxis x 28 days with aspirin

## 2016-08-11 NOTE — Clinical Social Work Placement (Signed)
   CLINICAL SOCIAL WORK PLACEMENT  NOTE  Date:  08/11/2016  Patient Details  Name: Jonathan Valencia MRN: [id number] Date of Birth: [birth year]  Clinical Social Work is seeking post-discharge placement for this patient at the Skilled  Nursing Facility level of care (*CSW will initial, date and re-position this form in  chart as items are completed):  Yes   Patient/family provided with [facility] Clinical Social Work Department's list of facilities offering this level of care within the geographic area requested by the patient (or if unable, by the patient's family).  Yes   Patient/family informed of their freedom to choose among providers that offer the needed level of care, that participate in Medicare, Medicaid or managed care program needed by the patient, have an available bed and are willing to accept the patient.  Yes   Patient/family informed of 's ownership interest in [facility] and [facility], as well as of the fact that they are under no obligation to receive care at these facilities.  PASRR submitted to EDS on 08/10/16     PASRR number received on 08/10/16     Existing PASRR number confirmed on       FL2 transmitted to all facilities in geographic area requested by pt/family on 08/10/16     FL2 transmitted to all facilities within larger geographic area on       Patient informed that his/her managed care company has contracts with or will negotiate with certain facilities, including the following:        Yes   Patient/family informed of bed offers received.  [facility]     Physician recommends and patient chooses bed at      Patient to be transferred to [facility] on 08/11/16.  [facility]     Patient family notified on 08/11/16 of transfer.  Name of family member notified:  [name]- daughter     PHYSICIAN       Additional Comment:     _______________________________________________ Karn CassisStultz, Camillo Quadros Shanaberger, LCSW 08/11/2016, 9:59 AM [phone]

## 2016-08-11 NOTE — Progress Notes (Signed)
Vickey HugerLawrence Twiggs discharged to SNF/ Rehab per MD order.  Report called to [facility] at 1050 and then updated at 1230.  Allergies as of 08/11/2016   No Known Allergies     Medication List    TAKE these medications   aspirin 325 MG EC tablet Take 1 tablet (325 mg total) by mouth daily with breakfast. Start taking on:  08/12/2016 What changed:  medication strength  how much to take  when to take this   divalproex 125 MG capsule Commonly known as:  DEPAKOTE SPRINKLE Take 125 mg by mouth daily. Take at 4 pm.   donepezil 10 MG tablet Commonly known as:  ARICEPT Take 10 mg by mouth daily.   HYDROcodone-acetaminophen 5-325 MG tablet Commonly known as:  NORCO Take 1-2 tablets by mouth every 6 (six) hours as needed for moderate pain.   quinapril 40 MG tablet Commonly known as:  ACCUPRIL Take 40 mg by mouth daily.   traZODone 50 MG tablet Commonly known as:  DESYREL Take 25 mg by mouth at bedtime.       IV site discontinued and catheter remains intact. Site without signs and symptoms of complications. Dressing and pressure applied.  Patient transported on a stretcher by non emergent EMS,  no distress noted upon discharge.  Rica KoyanagiBonnie M Stevan Eberwein 08/11/2016 12:31 PM

## 2016-08-11 NOTE — Telephone Encounter (Signed)
I called and scheduled an appointment for Jonathan Valencia on Friday, 08-22-16 at 9:30 with Dr. Romeo AppleHarrison.  I spoke with Denisha at [facility].  She is aware of the date and time and that Jonathan Valencia will need xrays prior to this visit.

## 2016-08-11 NOTE — Progress Notes (Signed)
Subjective: 2 Days Post-Op Procedure(s) (LRB): INTERNAL FIXATION RIGHT HIP (Right) Patient reports pain as moderate.    Objective: Vital signs in last 24 hours: Temp:  [97.8 F (36.6 C)-98.8 F (37.1 C)] 98.5 F (36.9 C) (01/25 0631) Pulse Rate:  [73-85] 79 (01/25 0631) Resp:  [18-20] 18 (01/25 0631) BP: (105-114)/(65-73) 114/67 (01/25 0631) SpO2:  [97 %-98 %] 98 % (01/25 0631)  Intake/Output from previous day: 01/24 0701 - 01/25 0700 In: 1843.8 [P.O.:600; I.V.:1243.8] Out: 650 [Urine:650] Intake/Output this shift: No intake/output data recorded.   PT  Pt received in bed, Dtr Jonathan Valencia arrived in the room, and was able to provide all home set up and PLOF information.  Prior to admission, pt was using a cane for ambulation during the day when Jonathan Valencia was at work, and then he does not use any device in the evenings.  Jonathan Valencia reports that he was requiring assistance for both dressing and bathing.  During PT evaluation he was lethargic and sleepy, but oriented x 3.  He required Max A for supine<>sit, and Max A for sit<>stand and SPT bed<>chair with RW.  Pt has difficulty with full extension of B knees, as requires cues to place weight through his UE's.  He is able to perform when cued, but does not maintain.  At this point, he is recommended for SNF due to multiple falls, and need for increased level of assistance for all functional mobility tasks at this time.       Recent Labs  08/10/16 0406 08/11/16 0616  HGB 12.7* 11.1*    Recent Labs  08/10/16 0406 08/11/16 0616  WBC 16.8* 10.8*  RBC 4.24 3.60*  HCT 37.5* 32.1*  PLT 376 335    Recent Labs  08/10/16 0406 08/11/16 0616  NA 133* 133*  K 3.7 3.6  CL 99* 100*  CO2 25 27  BUN 16 16  CREATININE 0.69 0.60*  GLUCOSE 151* 109*  CALCIUM 8.9 8.5*   No results for input(s): LABPT, INR in the last 72 hours.  Dorsiflexion/Plantar flexion intact No cellulitis present Compartment soft  Assessment/Plan: 2 Days Post-Op  Procedure(s) (LRB): INTERNAL FIXATION RIGHT HIP (Right) Discharge to SNF  [facility] Jonathan Valencia 08/11/2016, 11:44 AM

## 2016-08-12 NOTE — ED Notes (Signed)
Daughter stopped by looking for pt's 2 rings. Did nto see a note in chart. Checked with security as well but did not have it.  Advised daughter to check with medical records to check notes.

## 2016-08-13 LAB — TYPE AND SCREEN
ABO/RH(D): AB POS
ANTIBODY SCREEN: NEGATIVE
UNIT DIVISION: 0
Unit division: 0

## 2016-08-23 ENCOUNTER — Ambulatory Visit (HOSPITAL_COMMUNITY)
Admission: RE | Admit: 2016-08-23 | Discharge: 2016-08-23 | Disposition: A | Discharge status: Home / Self Care | Payer: No Typology Code available for payment source | Source: Ambulatory Visit | Attending: Orthopedic Surgery | Admitting: Orthopedic Surgery

## 2016-08-23 DIAGNOSIS — X58XXXD Exposure to other specified factors, subsequent encounter: Secondary | ICD-10-CM | POA: Diagnosis not present

## 2016-08-23 DIAGNOSIS — S72144D Nondisplaced intertrochanteric fracture of right femur, subsequent encounter for closed fracture with routine healing: Secondary | ICD-10-CM | POA: Diagnosis not present

## 2016-08-26 ENCOUNTER — Ambulatory Visit (INDEPENDENT_AMBULATORY_CARE_PROVIDER_SITE_OTHER): Payer: Self-pay | Admitting: Orthopedic Surgery

## 2016-08-26 DIAGNOSIS — Z4889 Encounter for other specified surgical aftercare: Secondary | ICD-10-CM

## 2016-08-26 DIAGNOSIS — S72141D Displaced intertrochanteric fracture of right femur, subsequent encounter for closed fracture with routine healing: Secondary | ICD-10-CM

## 2016-08-26 NOTE — Progress Notes (Signed)
Patient ID: [name], male   DOB: February 01, 1927, 83 y.o.   MRN: [id number]  Follow up visit/  Post op fracture care otif   Chief complaint he is sleeping his daughter is here she's not sure if he's getting up to walk  He's not complaining of any pain  All wounds look good staples were removed his x-ray today shows a stable fixation with a gamma nail, short nail right hip  Recommend weightbearing as tolerated follow-up in 5 weeks given x-ray at 4 weeks from now so we have a prior to visit  Encounter Diagnoses  Name Primary?  Marland Kitchen. Aftercare following surgery Yes  . Closed displaced intertrochanteric fracture of right femur with routine healing, subsequent encounter         Encounter Diagnosis  Name Primary?  Marland Kitchen. Aftercare following surgery Yes      9:59 AM Fuller CanadaStanley Harrison, MD 08/26/2016

## 2016-09-30 ENCOUNTER — Other Ambulatory Visit: Payer: Self-pay | Admitting: *Deleted

## 2016-09-30 ENCOUNTER — Ambulatory Visit: Payer: Medicare Other | Admitting: Orthopedic Surgery

## 2016-09-30 DIAGNOSIS — Z8781 Personal history of (healed) traumatic fracture: Secondary | ICD-10-CM

## 2016-10-03 ENCOUNTER — Ambulatory Visit (INDEPENDENT_AMBULATORY_CARE_PROVIDER_SITE_OTHER): Payer: Self-pay | Admitting: Orthopedic Surgery

## 2016-10-03 ENCOUNTER — Encounter: Payer: Self-pay | Admitting: Orthopedic Surgery

## 2016-10-03 ENCOUNTER — Ambulatory Visit (HOSPITAL_COMMUNITY)
Admission: RE | Admit: 2016-10-03 | Discharge: 2016-10-03 | Disposition: A | Discharge status: Home / Self Care | Payer: No Typology Code available for payment source | Source: Ambulatory Visit | Attending: Orthopedic Surgery | Admitting: Orthopedic Surgery

## 2016-10-03 DIAGNOSIS — X58XXXA Exposure to other specified factors, initial encounter: Secondary | ICD-10-CM | POA: Diagnosis not present

## 2016-10-03 DIAGNOSIS — Z8781 Personal history of (healed) traumatic fracture: Secondary | ICD-10-CM | POA: Diagnosis present

## 2016-10-03 DIAGNOSIS — S72141D Displaced intertrochanteric fracture of right femur, subsequent encounter for closed fracture with routine healing: Secondary | ICD-10-CM

## 2016-10-03 DIAGNOSIS — S7291XA Unspecified fracture of right femur, initial encounter for closed fracture: Secondary | ICD-10-CM | POA: Diagnosis not present

## 2016-10-03 NOTE — Progress Notes (Signed)
Patient ID: Jonathan HugerLawrence Valencia, male   DOB: 27-Jan-1927, 83 y.o.   MRN: [id number]  POSTOP VISIT   Chief Complaint  Patient presents with  . Follow-up    RIGHT HIP, DOS 08/09/16   Short gamma nail fixation inotrope fracture right hip postop day 55  The patient can get out of bed and get into his wheelchair he is walking with therapy with a walker  Hip flexion is pain-free leg lengths and rotation are normal  Encounter Diagnosis  Name Primary?  . Closed displaced intertrochanteric fracture of right femur with routine healing, subsequent encounter Yes    X-ray right hip in 6 weeks  2:54 PM Fuller CanadaStanley Harrison, MD 10/03/2016

## 2016-11-14 ENCOUNTER — Ambulatory Visit (INDEPENDENT_AMBULATORY_CARE_PROVIDER_SITE_OTHER): Payer: Medicare Other

## 2016-11-14 ENCOUNTER — Encounter: Payer: Self-pay | Admitting: Orthopedic Surgery

## 2016-11-14 ENCOUNTER — Ambulatory Visit (INDEPENDENT_AMBULATORY_CARE_PROVIDER_SITE_OTHER): Payer: Medicare Other | Admitting: Orthopedic Surgery

## 2016-11-14 DIAGNOSIS — S72141D Displaced intertrochanteric fracture of right femur, subsequent encounter for closed fracture with routine healing: Secondary | ICD-10-CM | POA: Diagnosis not present

## 2016-11-14 NOTE — Progress Notes (Signed)
Fracture care follow-up  Chief Complaint  Patient presents with  . Follow-up    Recheck on right hip, DOS 08-09-16.    Post injury day number  43 Chief Complaint  Patient presents with  . Follow-up    Recheck on right hip, DOS 08-09-16.     Fracture treated with  Short gamma nail   X-rays today show healed fracture Without hardware complication   Current Outpatient Prescriptions:  .  aspirin EC 325 MG EC tablet, Take 1 tablet (325 mg total) by mouth daily with breakfast., Disp: 30 tablet, Rfl: 0 .  divalproex (DEPAKOTE SPRINKLE) 125 MG capsule, Take 125 mg by mouth daily. Take at 4 pm., Disp: , Rfl:  .  donepezil (ARICEPT) 10 MG tablet, Take 10 mg by mouth daily., Disp: , Rfl:  .  HYDROcodone-acetaminophen (NORCO) 5-325 MG tablet, Take 1-2 tablets by mouth every 6 (six) hours as needed for moderate pain., Disp: 30 tablet, Rfl: 0 .  quinapril (ACCUPRIL) 40 MG tablet, Take 40 mg by mouth daily., Disp: , Rfl:  .  traZODone (DESYREL) 50 MG tablet, Take 25 mg by mouth at bedtime., Disp: , Rfl:    Clinical exam  right leg alignment length in flexion of the hip normal  Encounter Diagnosis  Name Primary?  . Closed displaced intertrochanteric fracture of right femur with routine healing, subsequent encounter Yes  X-rays normal today   Plan  Release follow-up as needed

## 2016-12-06 ENCOUNTER — Observation Stay (HOSPITAL_COMMUNITY): Payer: Medicare Other

## 2016-12-06 ENCOUNTER — Emergency Department (HOSPITAL_COMMUNITY): Payer: Medicare Other

## 2016-12-06 ENCOUNTER — Encounter (HOSPITAL_COMMUNITY): Payer: Self-pay | Admitting: *Deleted

## 2016-12-06 ENCOUNTER — Observation Stay (HOSPITAL_COMMUNITY)
Admission: EM | Admit: 2016-12-06 | Discharge: 2016-12-07 | Disposition: A | Discharge status: Home / Self Care | Payer: Medicare Other | Attending: Internal Medicine | Admitting: Internal Medicine

## 2016-12-06 DIAGNOSIS — F0390 Unspecified dementia without behavioral disturbance: Secondary | ICD-10-CM | POA: Insufficient documentation

## 2016-12-06 DIAGNOSIS — I959 Hypotension, unspecified: Secondary | ICD-10-CM | POA: Diagnosis present

## 2016-12-06 DIAGNOSIS — I10 Essential (primary) hypertension: Secondary | ICD-10-CM | POA: Insufficient documentation

## 2016-12-06 DIAGNOSIS — R55 Syncope and collapse: Principal | ICD-10-CM | POA: Insufficient documentation

## 2016-12-06 DIAGNOSIS — F0150 Vascular dementia without behavioral disturbance: Secondary | ICD-10-CM

## 2016-12-06 DIAGNOSIS — R4189 Other symptoms and signs involving cognitive functions and awareness: Secondary | ICD-10-CM | POA: Diagnosis not present

## 2016-12-06 DIAGNOSIS — Z7982 Long term (current) use of aspirin: Secondary | ICD-10-CM | POA: Insufficient documentation

## 2016-12-06 DIAGNOSIS — R4182 Altered mental status, unspecified: Secondary | ICD-10-CM | POA: Diagnosis present

## 2016-12-06 DIAGNOSIS — R296 Repeated falls: Secondary | ICD-10-CM | POA: Diagnosis not present

## 2016-12-06 DIAGNOSIS — L8990 Pressure ulcer of unspecified site, unspecified stage: Secondary | ICD-10-CM | POA: Insufficient documentation

## 2016-12-06 DIAGNOSIS — G9341 Metabolic encephalopathy: Secondary | ICD-10-CM

## 2016-12-06 LAB — CBC WITH DIFFERENTIAL/PLATELET
BASOS ABS: 0 10*3/uL (ref 0.0–0.1)
Basophils Relative: 0 %
EOS ABS: 0.1 10*3/uL (ref 0.0–0.7)
Eosinophils Relative: 1 %
HCT: 41.9 % (ref 39.0–52.0)
Hemoglobin: 14 g/dL (ref 13.0–17.0)
LYMPHS ABS: 1.7 10*3/uL (ref 0.7–4.0)
Lymphocytes Relative: 19 %
MCH: 29.3 pg (ref 26.0–34.0)
MCHC: 33.4 g/dL (ref 30.0–36.0)
MCV: 87.7 fL (ref 78.0–100.0)
MONO ABS: 0.7 10*3/uL (ref 0.1–1.0)
MONOS PCT: 8 %
NEUTROS PCT: 72 %
Neutro Abs: 6.3 10*3/uL (ref 1.7–7.7)
PLATELETS: 291 10*3/uL (ref 150–400)
RBC: 4.78 MIL/uL (ref 4.22–5.81)
RDW: 14.1 % (ref 11.5–15.5)
WBC: 8.8 10*3/uL (ref 4.0–10.5)

## 2016-12-06 LAB — URINALYSIS, ROUTINE W REFLEX MICROSCOPIC
BILIRUBIN URINE: NEGATIVE
GLUCOSE, UA: NEGATIVE mg/dL
HGB URINE DIPSTICK: NEGATIVE
KETONES UR: NEGATIVE mg/dL
Leukocytes, UA: NEGATIVE
Nitrite: NEGATIVE
PROTEIN: NEGATIVE mg/dL
Specific Gravity, Urine: 1.016 (ref 1.005–1.030)
pH: 6 (ref 5.0–8.0)

## 2016-12-06 LAB — COMPREHENSIVE METABOLIC PANEL
ALBUMIN: 3.7 g/dL (ref 3.5–5.0)
ALK PHOS: 73 U/L (ref 38–126)
ALT: 16 U/L — AB (ref 17–63)
AST: 19 U/L (ref 15–41)
Anion gap: 7 (ref 5–15)
BILIRUBIN TOTAL: 1.2 mg/dL (ref 0.3–1.2)
BUN: 22 mg/dL — AB (ref 6–20)
CALCIUM: 9.5 mg/dL (ref 8.9–10.3)
CO2: 28 mmol/L (ref 22–32)
CREATININE: 0.71 mg/dL (ref 0.61–1.24)
Chloride: 101 mmol/L (ref 101–111)
GFR calc Af Amer: >60 mL/min (ref >60)
GFR calc non Af Amer: >60 mL/min (ref >60)
Glucose, Bld: 111 mg/dL — ABNORMAL HIGH (ref 65–99)
Potassium: 3.8 mmol/L (ref 3.5–5.1)
SODIUM: 136 mmol/L (ref 135–145)
TOTAL PROTEIN: 6.8 g/dL (ref 6.5–8.1)

## 2016-12-06 LAB — TROPONIN I: Troponin I: <0.03 ng/mL (ref <0.03)

## 2016-12-06 LAB — LACTIC ACID, PLASMA
Lactic Acid, Venous: 1.2 mmol/L (ref 0.5–1.9)
Lactic Acid, Venous: 0.9 mmol/L (ref 0.5–1.9)

## 2016-12-06 LAB — CK: CK TOTAL: 29 U/L — AB (ref 49–397)

## 2016-12-06 MED ORDER — DIVALPROEX SODIUM 125 MG PO CSDR
125.0000 mg | DELAYED_RELEASE_CAPSULE | Freq: Every day | ORAL | Status: DC
  Administered 2016-12-06 – 2016-12-07 (×2): 125 mg via ORAL
  Filled 2016-12-06 (×4): qty 1

## 2016-12-06 MED ORDER — ENOXAPARIN SODIUM 30 MG/0.3ML ~~LOC~~ SOLN
30.0000 mg | SUBCUTANEOUS | Status: DC
  Administered 2016-12-06: 30 mg via SUBCUTANEOUS
  Filled 2016-12-06: qty 0.3

## 2016-12-06 MED ORDER — SODIUM CHLORIDE 0.9 % IV SOLN
INTRAVENOUS | Status: AC
  Administered 2016-12-06: 20:00:00 via INTRAVENOUS

## 2016-12-06 MED ORDER — SODIUM CHLORIDE 0.9 % IV BOLUS (SEPSIS)
500.0000 mL | Freq: Once | INTRAVENOUS | Status: AC
  Administered 2016-12-06: 500 mL via INTRAVENOUS

## 2016-12-06 MED ORDER — SODIUM CHLORIDE 0.9 % IV SOLN
INTRAVENOUS | Status: DC
  Administered 2016-12-06 – 2016-12-07 (×2): via INTRAVENOUS

## 2016-12-06 MED ORDER — DONEPEZIL HCL 5 MG PO TABS
10.0000 mg | ORAL_TABLET | Freq: Every day | ORAL | Status: DC
  Administered 2016-12-06 – 2016-12-07 (×2): 10 mg via ORAL
  Filled 2016-12-06 (×2): qty 2

## 2016-12-06 NOTE — ED Provider Notes (Signed)
[facility] Provider Note   CSN: [id number] Arrival date & time: 12/06/16  4098     History   Chief Complaint Chief Complaint  Patient presents with  . Altered Mental Status    HPI Jonathan Valencia is a 83 y.o. male.  The history is provided by the EMS personnel, the nursing home and the patient. The history is limited by the condition of the patient (Hx dementia).  Altered Mental Status    Pt was seen at 0845. Per EMS and NH report: Pt ate breakfast this morning, then went back to his room. Pt was found diaphoretic with AMS, responsive only to sternal rub, "BP 90/50, Sat 90's, RR 10, HR 44." CBG was reportedly normal. On arrival to ED, pt awake/alert, at baseline mental status (dementia) and denies any complaints.   Past Medical History:  Diagnosis Date  . Dementia   . Hypertension     Patient Active Problem List   Diagnosis Date Noted  . Closed intertrochanteric fracture, right, initial encounter (HCC)   . Hip fracture (HCC) 08/08/2016  . Fracture of greater trochanter of right femur (HCC) 08/07/2016  . Dementia 08/07/2016  . Falls frequently 08/07/2016  . Right hip pain 08/07/2016    Past Surgical History:  Procedure Laterality Date  . HERNIA REPAIR    . INTRAMEDULLARY (IM) NAIL INTERTROCHANTERIC Right 08/09/2016   Procedure: INTERNAL FIXATION RIGHT HIP;  Surgeon: Vickki Hearing, MD;  Location: [facility];  Service: Orthopedics;  Laterality: Right;       Home Medications    Prior to Admission medications   Medication Sig Start Date End Date Taking? Authorizing Provider  aspirin EC 325 MG EC tablet Take 1 tablet (325 mg total) by mouth daily with breakfast. 08/12/16   Erick Blinks, MD  divalproex (DEPAKOTE SPRINKLE) 125 MG capsule Take 125 mg by mouth daily. Take at 4 pm.    [provider]  donepezil (ARICEPT) 10 MG tablet Take 10 mg by mouth daily.    [provider]  HYDROcodone-acetaminophen (NORCO) 5-325 MG tablet Take 1-2  tablets by mouth every 6 (six) hours as needed for moderate pain. 08/11/16   Erick Blinks, MD  quinapril (ACCUPRIL) 40 MG tablet Take 40 mg by mouth daily.    [provider]  traZODone (DESYREL) 50 MG tablet Take 25 mg by mouth at bedtime.    [provider]    Family History No family history on file.  Social History Social History  Substance Use Topics  . Smoking status: Never Smoker  . Smokeless tobacco: Never Used  . Alcohol use No     Allergies   Patient has no known allergies.   Review of Systems Review of Systems  Unable to perform ROS: Dementia     Physical Exam Updated Vital Signs BP 121/67 (BP Location: Right Arm)   Pulse (!) 58   Temp 97.3 F (36.3 C) (Oral)   Resp 12   Ht 5' (1.524 m)   Wt 49.4 kg (109 lb)   SpO2 97%   BMI 21.29 kg/m   Patient Vitals for the past 24 hrs:  BP Temp Temp src Pulse Resp SpO2 Height Weight  12/06/16 1133 (!) 84/69 98.5 F (36.9 C) - (!) 52 14 100 % - -  12/06/16 1000 112/65 - - - 19 - - -  12/06/16 0930 108/64 - - (!) 49 16 100 % - -  12/06/16 0900 (!) 100/58 - - - 13 - - -  12/06/16 0840 - - - - - - 5' (1.524 m) 49.4 kg (109 lb)  12/06/16 0839 121/67 97.3 F (36.3 C) Oral (!) 58 12 97 % - -  12/06/16 0830 121/67 - - (!) 57 - 100 % - -    10:16:01 Orthostatic Vital Signs CA  Orthostatic Lying   BP- Lying: 109/69  Pulse- Lying: 54      Orthostatic Sitting  BP- Sitting: 105/63  Pulse- Sitting: 51      Orthostatic Standing at 0 minutes  BP- Standing at 0 minutes: 90/84  Pulse- Standing at 0 minutes: 65     Physical Exam 0850: Physical examination:  Nursing notes reviewed; Vital signs and O2 SAT reviewed;  Constitutional: Well developed, Well nourished, Well hydrated, In no acute distress; Head:  Normocephalic, atraumatic; Eyes: EOMI, PERRL, No scleral icterus; ENMT: Mouth and pharynx normal, Mucous membranes moist; Neck: Supple, Full range of motion, No lymphadenopathy; Cardiovascular:  Regular rate and rhythm, No gallop; Respiratory: Breath sounds clear & equal bilaterally, No wheezes.  Speaking full sentences with ease, Normal respiratory effort/excursion; Chest: Nontender, Movement normal; Abdomen: Soft, Nontender, Nondistended, Normal bowel sounds; Genitourinary: No CVA tenderness; Extremities: Pulses normal, No tenderness, No edema, No calf edema or asymmetry.; Neuro: Awake, alert, confused per hx dementia. Major CN grossly intact. No facial droop. Speech clear. Grips equal. Moves all extremities spontaneously and to command without apparent gross focal motor deficits.; Skin: Color normal, Warm, Dry.   ED Treatments / Results  Labs (all labs ordered are listed, but only abnormal results are displayed)   EKG  EKG Interpretation  Date/Time:  Tuesday Dec 06 2016 08:33:51 EDT Ventricular Rate:  52 PR Interval:    QRS Duration: 81 QT Interval:  446 QTC Calculation: 415 R Axis:   40 Text Interpretation:  Sinus rhythm Atrial premature complex Abnormal R-wave progression, early transition No old tracing to compare Confirmed by [facility]  MD, Nicholos Johns [phone]) on 12/06/2016 8:52:31 AM       Radiology   Procedures Procedures (including critical care time)  Medications Ordered in ED Medications - No data to display   Initial Impression / Assessment and Plan / ED Course  I have reviewed the triage vital signs and the nursing notes.  Pertinent labs & imaging results that were available during my care of the patient were reviewed by me and considered in my medical decision making (see chart for details).  MDM Reviewed: previous chart, nursing note and vitals Reviewed previous: labs and ECG Interpretation: labs, ECG, x-ray and CT scan    Results for orders placed or performed during the hospital encounter of 12/06/16  Comprehensive metabolic panel  Result Value Ref Range   Sodium 136 135 - 145 mmol/L   Potassium 3.8 3.5 - 5.1 mmol/L   Chloride 101 101 - 111  mmol/L   CO2 28 22 - 32 mmol/L   Glucose, Bld 111 (H) 65 - 99 mg/dL   BUN 22 (H) 6 - 20 mg/dL   Creatinine, Ser 1.91 0.61 - 1.24 mg/dL   Calcium 9.5 8.9 - 47.8 mg/dL   Total Protein 6.8 6.5 - 8.1 g/dL   Albumin 3.7 3.5 - 5.0 g/dL   AST 19 15 - 41 U/L   ALT 16 (L) 17 - 63 U/L   Alkaline Phosphatase 73 38 - 126 U/L   Total Bilirubin 1.2 0.3 - 1.2 mg/dL   GFR calc non Af Amer >60 >60 mL/min   GFR calc Af Amer >60 >60 mL/min  Anion gap 7 5 - 15  Troponin I  Result Value Ref Range   Troponin I <0.03 <0.03 ng/mL  Lactic acid, plasma  Result Value Ref Range   Lactic Acid, Venous 1.2 0.5 - 1.9 mmol/L  CBC with Differential  Result Value Ref Range   WBC 8.8 4.0 - 10.5 K/uL   RBC 4.78 4.22 - 5.81 MIL/uL   Hemoglobin 14.0 13.0 - 17.0 g/dL   HCT 16.141.9 09.639.0 - 04.552.0 %   MCV 87.7 78.0 - 100.0 fL   MCH 29.3 26.0 - 34.0 pg   MCHC 33.4 30.0 - 36.0 g/dL   RDW 40.914.1 81.111.5 - 91.415.5 %   Platelets 291 150 - 400 K/uL   Neutrophils Relative % 72 %   Neutro Abs 6.3 1.7 - 7.7 K/uL   Lymphocytes Relative 19 %   Lymphs Abs 1.7 0.7 - 4.0 K/uL   Monocytes Relative 8 %   Monocytes Absolute 0.7 0.1 - 1.0 K/uL   Eosinophils Relative 1 %   Eosinophils Absolute 0.1 0.0 - 0.7 K/uL   Basophils Relative 0 %   Basophils Absolute 0.0 0.0 - 0.1 K/uL  Urinalysis, Routine w reflex microscopic  Result Value Ref Range   Color, Urine YELLOW YELLOW   APPearance CLEAR CLEAR   Specific Gravity, Urine 1.016 1.005 - 1.030   pH 6.0 5.0 - 8.0   Glucose, UA NEGATIVE NEGATIVE mg/dL   Hgb urine dipstick NEGATIVE NEGATIVE   Bilirubin Urine NEGATIVE NEGATIVE   Ketones, ur NEGATIVE NEGATIVE mg/dL   Protein, ur NEGATIVE NEGATIVE mg/dL   Nitrite NEGATIVE NEGATIVE   Leukocytes, UA NEGATIVE NEGATIVE   Dg Chest 2 View Result Date: 12/06/2016 CLINICAL DATA:  Altered mental status.  Syncope. EXAM: CHEST  2 VIEW COMPARISON:  08/07/2016 FINDINGS: Overall heart size and pulmonary vascularity are normal and the lungs are clear except  for calcified granuloma in the left upper lobe. No effusions. No acute bone abnormality. IMPRESSION: No acute abnormalities. Electronically Signed   By: Francene BoyersJames  Maxwell M.D.   On: 12/06/2016 09:23   Ct Head Wo Contrast Result Date: 12/06/2016 CLINICAL DATA:  Altered mental status.  Syncope this morning. EXAM: CT HEAD WITHOUT CONTRAST TECHNIQUE: Contiguous axial images were obtained from the base of the skull through the vertex without intravenous contrast. COMPARISON:  08/01/2016 FINDINGS: Brain: No evidence of acute infarction, hemorrhage, hydrocephalus, extra-axial collection or mass lesion/mass effect. Moderate cortical atrophy that is generalized. Vascular: Atherosclerotic calcification.  No hyperdense vessel. Skull: No acute or aggressive finding. Sinuses/Orbits: Bilateral cataract resection.  No acute finding IMPRESSION: 1. No acute finding or change from prior. 2. Moderate generalized cortical atrophy. Electronically Signed   By: Marnee SpringJonathon  Watts M.D.   On: 12/06/2016 09:55    1145:  Question cardiac syncope vs seizure as cause for symptoms today. BP labile here, +orthostatic; judicious IVF bolus given. Dx and testing d/w pt and family.  Questions answered.  Verb understanding, agreeable to admit. T/C to [facility] Dr. Rinaldo RatelKadolph, case discussed, including:  HPI, pertinent PM/SHx, VS/PE, dx testing, ED course and treatment:  Agreeable to come to ED for evaluation for admission.   Final Clinical Impressions(s) / ED Diagnoses   Final diagnoses:  None    New Prescriptions New Prescriptions   No medications on file      Samuel JesterMcManus, Quashawn Jewkes, DO 12/07/16 1105

## 2016-12-06 NOTE — ED Triage Notes (Addendum)
Pt brought in by [facility] from [facility]. [facility] reports that pt ate breakfast this morning and then went back to his room and was found to be responsive only to sternal rub, HR 44, resp 10, o2 sat 90%, BP 90/50, pt diaphoretic. [facility] reports pt has hx dementia but is normally alert and able to transfer from bed to wheelchair with minimal nursing assist. Pt presents to ED alert and oriented to self and place. Pt states, "ain't nothing wrong with me". Pt denies feeling bad this morning and denies pain. CBG 136 for EMS. VSS. Pt non-diaphoretic upon arrival.

## 2016-12-06 NOTE — H&P (Signed)
History and Physical    Jonathan HugerLawrence Valencia WUJ:[id number] DOB: 08/25/1926 DOA: 12/06/2016  PCP: System, Provider Not In   Patient coming from: [facility]  Chief Complaint: Altered Mental Status, unresponsiveness  HPI: Jonathan HugerLawrence Valencia is a 83 y.o. male with medical history significant of dementia, hip fx in January of 2018, HTN that was brought in via EMS after [facility].  Per EMS report "Pt ate breakfast this morning, then went back to his room. Pt was found diaphoretic with AMS, responsive only to sternal rub, "BP 90/50, Sat 90's, RR 10, HR 44."  EMS was called and by time patient arrived in the ED he was back to baseline mental status with systolic BP in the 90s.  No reports of falls, seizure like activity.  Patient denies any pain but is a poor historian.  Per patient's daughter nothing like this has ever happened before.  ED Course: Patient was found to be slightly hypotensive (lowest systolic BP of 89), bradycardic but otherwise back to baseline.  He was given IVF.  UA negative, laboratory workup all WNL, CT of the head negative, EKG showing sinus with premature atrial complexes.  [facility] asked to admit for hypotension and episode of unresponsiveness.  Review of Systems: As per HPI otherwise 10 point review of systems negative.    Past Medical History:  Diagnosis Date  . Dementia   . Hypertension     Past Surgical History:  Procedure Laterality Date  . HERNIA REPAIR    . INTRAMEDULLARY (IM) NAIL INTERTROCHANTERIC Right 08/09/2016   Procedure: INTERNAL FIXATION RIGHT HIP;  Surgeon: Vickki HearingStanley E Harrison, MD;  Location: [facility];  Service: Orthopedics;  Laterality: Right;     reports that he has never smoked. He has never used smokeless tobacco. He reports that he does not drink alcohol or use drugs.  No Known Allergies  History reviewed. No pertinent family history.   Prior to Admission medications   Medication Sig Start Date End Date Taking?  Authorizing Provider  cholecalciferol (VITAMIN D) 1000 units tablet Take 1,000 Units by mouth daily.   Yes [provider]  divalproex (DEPAKOTE SPRINKLE) 125 MG capsule Take 125 mg by mouth daily. Take at 4 pm.   Yes [provider]  donepezil (ARICEPT) 10 MG tablet Take 10 mg by mouth daily.   Yes [provider]  HYDROcodone-acetaminophen (NORCO) 5-325 MG tablet Take 1-2 tablets by mouth every 6 (six) hours as needed for moderate pain. 08/11/16  Yes Erick BlinksMemon, Jehanzeb, MD  lisinopril (PRINIVIL,ZESTRIL) 20 MG tablet Take 20 mg by mouth daily.   Yes [provider]  Multiple Vitamin (MULTIVITAMIN WITH MINERALS) TABS tablet Take 1 tablet by mouth daily.   Yes [provider]  traZODone (DESYREL) 50 MG tablet Take 50 mg by mouth at bedtime.    Yes [provider]    Physical Exam: Vitals:   12/06/16 1300 12/06/16 1308 12/06/16 1324 12/06/16 1341  BP: (!) 89/65 (!) 107/58 114/75 122/61  Pulse: (!) 49  (!) 48 (!) 55  Resp: 12  12 14   Temp:    97.7 F (36.5 C)  TempSrc:    Oral  SpO2: 99%  99% 100%  Weight:    52.2 kg (115 lb 1.3 oz)  Height:    5' (1.524 m)      Constitutional: NAD, calm, comfortable Vitals:   12/06/16 1300 12/06/16 1308 12/06/16 1324 12/06/16 1341  BP: (!) 89/65 (!) 107/58 114/75 122/61  Pulse: Marland Kitchen(!)  49  (!) 48 (!) 55  Resp: 12  12 14   Temp:    97.7 F (36.5 C)  TempSrc:    Oral  SpO2: 99%  99% 100%  Weight:    52.2 kg (115 lb 1.3 oz)  Height:    5' (1.524 m)   Eyes: PERRL, lids and conjunctivae normal ENMT: Mucous membranes are moist. Posterior pharynx clear of any exudate or lesions.Normal dentition.  Neck: normal, supple, no masses, no thyromegaly Respiratory: clear to auscultation bilaterally, no wheezing, no crackles. Normal respiratory effort. No accessory muscle use.  Cardiovascular: Regular rate and rhythm, no murmurs / rubs / gallops. No extremity edema. 2+ pedal pulses. No carotid bruits.  Abdomen: no  tenderness, no masses palpated. No hepatosplenomegaly. Bowel sounds positive.  Musculoskeletal: no clubbing / cyanosis. No joint deformity upper and lower extremities. Good ROM, no contractures. Normal muscle tone.  Skin: bilateral arm bandages- unclear if/ when patient fell (daughter was not notified) Neurologic: CN 2-12 grossly intact. Sensation intact, DTR normal. Strength 5/5 in all 4.  Psychiatric: Alert and oriented to self, pleasantly confused.    Labs on Admission: I have personally reviewed following labs and imaging studies  CBC:  Recent Labs Lab 12/06/16 0930  WBC 8.8  NEUTROABS 6.3  HGB 14.0  HCT 41.9  MCV 87.7  PLT 291   Basic Metabolic Panel:  Recent Labs Lab 12/06/16 0925  NA 136  K 3.8  CL 101  CO2 28  GLUCOSE 111*  BUN 22*  CREATININE 0.71  CALCIUM 9.5   GFR: Estimated Creatinine Clearance: 46 mL/min (by C-G formula based on SCr of 0.71 mg/dL). Liver Function Tests:  Recent Labs Lab 12/06/16 0925  AST 19  ALT 16*  ALKPHOS 73  BILITOT 1.2  PROT 6.8  ALBUMIN 3.7   No results for input(s): LIPASE, AMYLASE in the last 168 hours. No results for input(s): AMMONIA in the last 168 hours. Coagulation Profile: No results for input(s): INR, PROTIME in the last 168 hours. Cardiac Enzymes:  Recent Labs Lab 12/06/16 0930 12/06/16 1237 12/06/16 1546  CKTOTAL  --  29*  --   TROPONINI <0.03  --  <0.03   BNP (last 3 results) No results for input(s): PROBNP in the last 8760 hours. HbA1C: No results for input(s): HGBA1C in the last 72 hours. CBG: No results for input(s): GLUCAP in the last 168 hours. Lipid Profile: No results for input(s): CHOL, HDL, LDLCALC, TRIG, CHOLHDL, LDLDIRECT in the last 72 hours. Thyroid Function Tests: No results for input(s): TSH, T4TOTAL, FREET4, T3FREE, THYROIDAB in the last 72 hours. Anemia Panel: No results for input(s): VITAMINB12, FOLATE, FERRITIN, TIBC, IRON, RETICCTPCT in the last 72 hours. Urine analysis:      Component Value Date/Time   COLORURINE YELLOW 12/06/2016 1010   APPEARANCEUR CLEAR 12/06/2016 1010   LABSPEC 1.016 12/06/2016 1010   PHURINE 6.0 12/06/2016 1010   GLUCOSEU NEGATIVE 12/06/2016 1010   HGBUR NEGATIVE 12/06/2016 1010   BILIRUBINUR NEGATIVE 12/06/2016 1010   KETONESUR NEGATIVE 12/06/2016 1010   PROTEINUR NEGATIVE 12/06/2016 1010   NITRITE NEGATIVE 12/06/2016 1010   LEUKOCYTESUR NEGATIVE 12/06/2016 1010   Sepsis Labs: !!!!!!!!!!!!!!!!!!!!!!!!!!!!!!!!!!!!!!!!!!!! @LABRCNTIP (procalcitonin:4,lacticidven:4) )No results found for this or any previous visit (from the past 240 hour(s)).   Radiological Exams on Admission: Dg Chest 2 View  Result Date: 12/06/2016 CLINICAL DATA:  Altered mental status.  Syncope. EXAM: CHEST  2 VIEW COMPARISON:  08/07/2016 FINDINGS: Overall heart size and pulmonary vascularity are normal and the lungs  are clear except for calcified granuloma in the left upper lobe. No effusions. No acute bone abnormality. IMPRESSION: No acute abnormalities. Electronically Signed   By: Francene Boyers M.D.   On: 12/06/2016 09:23   Ct Head Wo Contrast  Result Date: 12/06/2016 CLINICAL DATA:  Altered mental status.  Syncope this morning. EXAM: CT HEAD WITHOUT CONTRAST TECHNIQUE: Contiguous axial images were obtained from the base of the skull through the vertex without intravenous contrast. COMPARISON:  08/01/2016 FINDINGS: Brain: No evidence of acute infarction, hemorrhage, hydrocephalus, extra-axial collection or mass lesion/mass effect. Moderate cortical atrophy that is generalized. Vascular: Atherosclerotic calcification.  No hyperdense vessel. Skull: No acute or aggressive finding. Sinuses/Orbits: Bilateral cataract resection.  No acute finding IMPRESSION: 1. No acute finding or change from prior. 2. Moderate generalized cortical atrophy. Electronically Signed   By: Marnee Spring M.D.   On: 12/06/2016 09:55    EKG: Independently reviewed. Sinus rhythm with premature  atrial complexes  Assessment/Plan Principal Problem:   Hypotension Active Problems:   Dementia   Falls frequently     Hypotension - giving IVF - BP responding - unclear why patient became suddenly hypotension - trend troponins - place as obs on telemetry - hold BP medications  Dementia - per patient's daughter this is patient's baseline  Falls Frequently - patient mostly nonambulatory at [facility] - will have PT see patient - since his hip fx repair patient has mostly been wheelchair bound - ambulate with assistance only - fall risk   DVT prophylaxis: lovenox Code Status:  Full code  Family Communication:  Daughter and her fiancee present Disposition Plan: likely discharge back to [facility] on 5/23  Consults called: none  Admission status: observation, telemetry    Katrinka Blazing MD [facility] Pager [phone]  If 7PM-7AM, please contact night-coverage [email or web address] Password [facility]  12/06/2016, 5:39 PM

## 2016-12-07 DIAGNOSIS — F0390 Unspecified dementia without behavioral disturbance: Secondary | ICD-10-CM

## 2016-12-07 DIAGNOSIS — R4189 Other symptoms and signs involving cognitive functions and awareness: Secondary | ICD-10-CM

## 2016-12-07 DIAGNOSIS — G9341 Metabolic encephalopathy: Secondary | ICD-10-CM | POA: Diagnosis not present

## 2016-12-07 DIAGNOSIS — L8990 Pressure ulcer of unspecified site, unspecified stage: Secondary | ICD-10-CM | POA: Insufficient documentation

## 2016-12-07 DIAGNOSIS — I959 Hypotension, unspecified: Secondary | ICD-10-CM | POA: Diagnosis not present

## 2016-12-07 DIAGNOSIS — R404 Transient alteration of awareness: Secondary | ICD-10-CM | POA: Diagnosis not present

## 2016-12-07 DIAGNOSIS — I950 Idiopathic hypotension: Secondary | ICD-10-CM

## 2016-12-07 DIAGNOSIS — R55 Syncope and collapse: Secondary | ICD-10-CM | POA: Diagnosis not present

## 2016-12-07 LAB — BASIC METABOLIC PANEL
ANION GAP: 7 (ref 5–15)
BUN: 16 mg/dL (ref 6–20)
CHLORIDE: 103 mmol/L (ref 101–111)
CO2: 24 mmol/L (ref 22–32)
Calcium: 8.8 mg/dL — ABNORMAL LOW (ref 8.9–10.3)
Creatinine, Ser: 0.58 mg/dL — ABNORMAL LOW (ref 0.61–1.24)
GFR calc non Af Amer: >60 mL/min (ref >60)
Glucose, Bld: 95 mg/dL (ref 65–99)
POTASSIUM: 3.6 mmol/L (ref 3.5–5.1)
Sodium: 134 mmol/L — ABNORMAL LOW (ref 135–145)

## 2016-12-07 LAB — CBC
HEMATOCRIT: 39.2 % (ref 39.0–52.0)
HEMOGLOBIN: 13.2 g/dL (ref 13.0–17.0)
MCH: 29.3 pg (ref 26.0–34.0)
MCHC: 33.7 g/dL (ref 30.0–36.0)
MCV: 86.9 fL (ref 78.0–100.0)
Platelets: 287 10*3/uL (ref 150–400)
RBC: 4.51 MIL/uL (ref 4.22–5.81)
RDW: 14 % (ref 11.5–15.5)
WBC: 8.1 10*3/uL (ref 4.0–10.5)

## 2016-12-07 LAB — MRSA PCR SCREENING: MRSA by PCR: NEGATIVE

## 2016-12-07 LAB — TROPONIN I

## 2016-12-07 MED ORDER — ENOXAPARIN SODIUM 40 MG/0.4ML ~~LOC~~ SOLN: 40.0000 mg | SUBCUTANEOUS | Status: DC

## 2016-12-07 NOTE — Discharge Summary (Signed)
Physician Discharge Summary  Jonathan Valencia ZOX:[id number] DOB: [birth year] DOA: 12/06/2016  PCP: System, Provider Not In  Admit date: 12/06/2016 Discharge date: 12/07/2016  Admitted From: SNF Disposition:  SNF  Recommendations for Outpatient Follow-up:  1. Follow up with PCP in 1-2 weeks 2. Please obtain BMP/CBC in one week     Discharge Condition: Stable CODE STATUS:FULL Diet recommendation: Heart Healthy    Brief/Interim Summary: 83 y.o. male with medical history significant of dementia, hip fx in January of 2018, HTN that was brought in via EMS after being found unresponsive at [facility].  Per EMS report "Pt ate breakfast this morning, then went back to his room. Pt was found diaphoretic with AMS, responsive only to sternal rub, "BP 90/50, Sat 90's, RR 10, HR 44."  EMS was called and by time patient arrived in the ED he was back to baseline mental status with systolic BP in the 90s.  No reports of falls, seizure like activity.  Patient denies any pain but is a poor historian.  Per patient's daughter nothing like this has ever happened before. In the emergency department, the patient was noted to have systolic blood pressure of 89 with mild bradycardia in the mid 40s and lower 50s. The patient's BP improved with fluid resuscitation.  The patient was admitted and observed overnight. The patient had continued bradycardia with heart rate in the mid 40s without any evidence of AV block or pulses. His mental status remained stable throughout the hospitalization. His blood pressure improved with systolic blood pressure in the mid 130s. His lisinopril was discontinued altogether. It is thought that the patient's transient altered mental status was likely due to his hypotension resulting in hypoperfusion. MRI of the brain and CT of the brain were negative for any acute findings. Urinalysis was negative.   Discharge Diagnoses:  Acute metabolic encephalopathy -Secondary to  hypotension with hypoperfusion -Mental status back to baseline with fluid resuscitation -Urinalysis is negative for pyuria -CT and MRI brain negative -CXR--neg  Hypotension -Likely secondary to volume depletion in the setting of lisinopril and hydrocodone -will not restart lisinopril -monitor BP after d/c -troponins neg x 3  Dementia without behavior disturbance -continue Aricept, depakote, trazodone  Sinus bradycardia -partly due to Aricept and acetylcholinesterase inhibition -asymptomatic -no AV block or pauses on tele  Falls Frequently - patient mostly nonambulatory at [facility] - PT see patient     Discharge Instructions  Discharge Instructions    Diet - low sodium heart healthy    Complete by:  As directed    Increase activity slowly    Complete by:  As directed      Allergies as of 12/07/2016   No Known Allergies     Medication List    STOP taking these medications   HYDROcodone-acetaminophen 5-325 MG tablet Commonly known as:  NORCO   lisinopril 20 MG tablet Commonly known as:  PRINIVIL,ZESTRIL     TAKE these medications   cholecalciferol 1000 units tablet Commonly known as:  VITAMIN D Take 1,000 Units by mouth daily.   divalproex 125 MG capsule Commonly known as:  DEPAKOTE SPRINKLE Take 125 mg by mouth daily. Take at 4 pm.   donepezil 10 MG tablet Commonly known as:  ARICEPT Take 10 mg by mouth daily.   multivitamin with minerals Tabs tablet Take 1 tablet by mouth daily.   traZODone 50 MG tablet Commonly known as:  DESYREL Take 50 mg by mouth at bedtime.       No  Known Allergies  Consultations:  none   Procedures/Studies: Dg Chest 2 View  Result Date: 12/06/2016 CLINICAL DATA:  Altered mental status.  Syncope. EXAM: CHEST  2 VIEW COMPARISON:  08/07/2016 FINDINGS: Overall heart size and pulmonary vascularity are normal and the lungs are clear except for calcified granuloma in the left upper lobe. No effusions. No acute bone  abnormality. IMPRESSION: No acute abnormalities. Electronically Signed   By: Francene Boyers M.D.   On: 12/06/2016 09:23   Ct Head Wo Contrast  Result Date: 12/06/2016 CLINICAL DATA:  Altered mental status.  Syncope this morning. EXAM: CT HEAD WITHOUT CONTRAST TECHNIQUE: Contiguous axial images were obtained from the base of the skull through the vertex without intravenous contrast. COMPARISON:  08/01/2016 FINDINGS: Brain: No evidence of acute infarction, hemorrhage, hydrocephalus, extra-axial collection or mass lesion/mass effect. Moderate cortical atrophy that is generalized. Vascular: Atherosclerotic calcification.  No hyperdense vessel. Skull: No acute or aggressive finding. Sinuses/Orbits: Bilateral cataract resection.  No acute finding IMPRESSION: 1. No acute finding or change from prior. 2. Moderate generalized cortical atrophy. Electronically Signed   By: Marnee Spring M.D.   On: 12/06/2016 09:55   Mr Brain Wo Contrast  Result Date: 12/06/2016 CLINICAL DATA:  Unresponsive.  History of dementia and hypertensive. EXAM: MRI HEAD WITHOUT CONTRAST TECHNIQUE: Multiplanar, multiecho pulse sequences of the brain and surrounding structures were obtained without intravenous contrast. COMPARISON:  CT HEAD Dec 06, 2016 FINDINGS: BRAIN: No reduced diffusion to suggest acute ischemia. No susceptibility artifact to suggest hemorrhage. The ventricles and sulci are normal for patient's age. Patchy supratentorial white matter FLAIR T2 hyperintensities compatible with mild to moderate chronic small vessel ischemic disease, less than expected for age. Old small LEFT cerebellar artifact. No suspicious parenchymal signal, masses or mass effect. No abnormal extra-axial fluid collections. VASCULAR: Major intracranial vascular flow voids present at skull base, dolichoectasia associated with chronic hypertension. SKULL AND UPPER CERVICAL SPINE: No abnormal sellar expansion. No suspicious calvarial bone marrow signal.  Craniocervical junction maintained. SINUSES/ORBITS: Trace maxillary sinus mucosal thickening. Mastoid air cells are well aerated. The included ocular globes and orbital contents are non-suspicious. Status post bilateral ocular lens implants. OTHER: Patient is edentulous. IMPRESSION: No acute intracranial process. Old small LEFT cerebellar infarct and mild to moderate chronic small vessel ischemic disease. Electronically Signed   By: Awilda Metro M.D.   On: 12/06/2016 22:24   Dg Hip Unilat With Pelvis 2-3 Views Right  Result Date: 11/14/2016 Radiology reading dictated by Dr. Romeo Apple AP lateral right hip status post short gamma nail fixation right hip AP lateral x-rays show a fracture of the proximal femur area intertrochanteric region with a fixating gamma nail in good position without complications and showing appropriate progression towards healing Impression stable gamma nail fixation intertrochanteric type fracture right hip        Discharge Exam: Vitals:   12/07/16 0000 12/07/16 0454  BP: 124/64 138/87  Pulse: (!) 53 (!) 51  Resp: 16 18  Temp: 97.5 F (36.4 C) 97.4 F (36.3 C)   Vitals:   12/06/16 1324 12/06/16 1341 12/07/16 0000 12/07/16 0454  BP: 114/75 122/61 124/64 138/87  Pulse: (!) 48 (!) 55 (!) 53 (!) 51  Resp: 12 14 16 18   Temp:  97.7 F (36.5 C) 97.5 F (36.4 C) 97.4 F (36.3 C)  TempSrc:  Oral Oral Oral  SpO2: 99% 100% 100% 100%  Weight:  52.2 kg (115 lb 1.3 oz)    Height:  5' (1.524 m)  General: Pt is alert, awake, not in acute distress Cardiovascular: RRR, S1/S2 +, no rubs, no gallops Respiratory: CTA bilaterally, no wheezing, no rhonchi Abdominal: Soft, NT, ND, bowel sounds + Extremities: no edema, no cyanosis   The results of significant diagnostics from this hospitalization (including imaging, microbiology, ancillary and laboratory) are listed below for reference.    Significant Diagnostic Studies: Dg Chest 2 View  Result Date:  12/06/2016 CLINICAL DATA:  Altered mental status.  Syncope. EXAM: CHEST  2 VIEW COMPARISON:  08/07/2016 FINDINGS: Overall heart size and pulmonary vascularity are normal and the lungs are clear except for calcified granuloma in the left upper lobe. No effusions. No acute bone abnormality. IMPRESSION: No acute abnormalities. Electronically Signed   By: Francene Boyers M.D.   On: 12/06/2016 09:23   Ct Head Wo Contrast  Result Date: 12/06/2016 CLINICAL DATA:  Altered mental status.  Syncope this morning. EXAM: CT HEAD WITHOUT CONTRAST TECHNIQUE: Contiguous axial images were obtained from the base of the skull through the vertex without intravenous contrast. COMPARISON:  08/01/2016 FINDINGS: Brain: No evidence of acute infarction, hemorrhage, hydrocephalus, extra-axial collection or mass lesion/mass effect. Moderate cortical atrophy that is generalized. Vascular: Atherosclerotic calcification.  No hyperdense vessel. Skull: No acute or aggressive finding. Sinuses/Orbits: Bilateral cataract resection.  No acute finding IMPRESSION: 1. No acute finding or change from prior. 2. Moderate generalized cortical atrophy. Electronically Signed   By: Marnee Spring M.D.   On: 12/06/2016 09:55   Mr Brain Wo Contrast  Result Date: 12/06/2016 CLINICAL DATA:  Unresponsive.  History of dementia and hypertensive. EXAM: MRI HEAD WITHOUT CONTRAST TECHNIQUE: Multiplanar, multiecho pulse sequences of the brain and surrounding structures were obtained without intravenous contrast. COMPARISON:  CT HEAD Dec 06, 2016 FINDINGS: BRAIN: No reduced diffusion to suggest acute ischemia. No susceptibility artifact to suggest hemorrhage. The ventricles and sulci are normal for patient's age. Patchy supratentorial white matter FLAIR T2 hyperintensities compatible with mild to moderate chronic small vessel ischemic disease, less than expected for age. Old small LEFT cerebellar artifact. No suspicious parenchymal signal, masses or mass effect. No  abnormal extra-axial fluid collections. VASCULAR: Major intracranial vascular flow voids present at skull base, dolichoectasia associated with chronic hypertension. SKULL AND UPPER CERVICAL SPINE: No abnormal sellar expansion. No suspicious calvarial bone marrow signal. Craniocervical junction maintained. SINUSES/ORBITS: Trace maxillary sinus mucosal thickening. Mastoid air cells are well aerated. The included ocular globes and orbital contents are non-suspicious. Status post bilateral ocular lens implants. OTHER: Patient is edentulous. IMPRESSION: No acute intracranial process. Old small LEFT cerebellar infarct and mild to moderate chronic small vessel ischemic disease. Electronically Signed   By: Awilda Metro M.D.   On: 12/06/2016 22:24   Dg Hip Unilat With Pelvis 2-3 Views Right  Result Date: 11/14/2016 Radiology reading dictated by Dr. Romeo Apple AP lateral right hip status post short gamma nail fixation right hip AP lateral x-rays show a fracture of the proximal femur area intertrochanteric region with a fixating gamma nail in good position without complications and showing appropriate progression towards healing Impression stable gamma nail fixation intertrochanteric type fracture right hip    Microbiology: Recent Results (from the past 240 hour(s))  MRSA PCR Screening     Status: None   Collection Time: 12/06/16 11:20 PM  Result Value Ref Range Status   MRSA by PCR NEGATIVE NEGATIVE Final    Comment:        The GeneXpert MRSA Assay (FDA approved for NASAL specimens only), is one component of a  comprehensive MRSA colonization surveillance program. It is not intended to diagnose MRSA infection nor to guide or monitor treatment for MRSA infections.      Labs: Basic Metabolic Panel:  Recent Labs Lab 12/06/16 0925 12/07/16 0306  NA 136 134*  K 3.8 3.6  CL 101 103  CO2 28 24  GLUCOSE 111* 95  BUN 22* 16  CREATININE 0.71 0.58*  CALCIUM 9.5 8.8*   Liver Function  Tests:  Recent Labs Lab 12/06/16 0925  AST 19  ALT 16*  ALKPHOS 73  BILITOT 1.2  PROT 6.8  ALBUMIN 3.7   No results for input(s): LIPASE, AMYLASE in the last 168 hours. No results for input(s): AMMONIA in the last 168 hours. CBC:  Recent Labs Lab 12/06/16 0930 12/07/16 0306  WBC 8.8 8.1  NEUTROABS 6.3  --   HGB 14.0 13.2  HCT 41.9 39.2  MCV 87.7 86.9  PLT 291 287   Cardiac Enzymes:  Recent Labs Lab 12/06/16 0930 12/06/16 1237 12/06/16 1546 12/06/16 2057 12/07/16 0306  CKTOTAL  --  29*  --   --   --   TROPONINI <0.03  --  <0.03 <0.03 <0.03   BNP: Invalid input(s): POCBNP CBG: No results for input(s): GLUCAP in the last 168 hours.  Time coordinating discharge:  Greater than 30 minutes  Signed:  Akita Maxim, DO [facility] Pager: [phone] 12/07/2016, 10:19 AM

## 2016-12-07 NOTE — Clinical Social Work Note (Addendum)
Patient from [facility] and will be returning. Patient in observation for less than 24 hours and will not need another FL2.   LCSW notified Annice PihJackie at [facility] that patient would discharge today and would return to the facility. LCSW advised that no FL2 would be needed.  LCSW notified patient's daughter, Ms. Ore, of patient's discharge.   LCSW signing off.     Monalisa Bayless, Juleen ChinaHeather D, LCSW

## 2016-12-07 NOTE — Progress Notes (Signed)
Report called to [facility]. 

## 2016-12-07 NOTE — Care Management Note (Signed)
Case Management Note  Patient Details  Name: Jonathan Valencia MRN: [id number] Date of Birth: 07-07-1927  If discussed at Long Length of Stay Meetings, dates discussed:    Additional Comments: Patient discharging back to [facility]. CSW making arrangements. No CM needs.   [name], [name], RN 12/07/2016, 10:43 AM

## 2016-12-08 LAB — URINE CULTURE: Culture: NO GROWTH

## 2017-05-05 ENCOUNTER — Emergency Department (HOSPITAL_COMMUNITY): Payer: Medicare Other

## 2017-05-05 ENCOUNTER — Inpatient Hospital Stay (HOSPITAL_COMMUNITY)
Admission: EM | Admit: 2017-05-05 | Discharge: 2017-05-07 | DRG: 481 | Disposition: A | Discharge status: Skilled Nursing Facility | Payer: Medicare Other | Attending: Internal Medicine | Admitting: Internal Medicine

## 2017-05-05 DIAGNOSIS — Y92129 Unspecified place in nursing home as the place of occurrence of the external cause: Secondary | ICD-10-CM

## 2017-05-05 DIAGNOSIS — M810 Age-related osteoporosis without current pathological fracture: Secondary | ICD-10-CM | POA: Diagnosis present

## 2017-05-05 DIAGNOSIS — E860 Dehydration: Secondary | ICD-10-CM | POA: Diagnosis present

## 2017-05-05 DIAGNOSIS — I10 Essential (primary) hypertension: Secondary | ICD-10-CM | POA: Diagnosis present

## 2017-05-05 DIAGNOSIS — W19XXXA Unspecified fall, initial encounter: Secondary | ICD-10-CM | POA: Diagnosis present

## 2017-05-05 DIAGNOSIS — S72002A Fracture of unspecified part of neck of left femur, initial encounter for closed fracture: Secondary | ICD-10-CM

## 2017-05-05 DIAGNOSIS — S72009A Fracture of unspecified part of neck of unspecified femur, initial encounter for closed fracture: Secondary | ICD-10-CM | POA: Diagnosis present

## 2017-05-05 DIAGNOSIS — Z79899 Other long term (current) drug therapy: Secondary | ICD-10-CM

## 2017-05-05 DIAGNOSIS — F0390 Unspecified dementia without behavioral disturbance: Secondary | ICD-10-CM | POA: Diagnosis present

## 2017-05-05 DIAGNOSIS — S72142A Displaced intertrochanteric fracture of left femur, initial encounter for closed fracture: Secondary | ICD-10-CM | POA: Diagnosis not present

## 2017-05-05 DIAGNOSIS — Z419 Encounter for procedure for purposes other than remedying health state, unspecified: Secondary | ICD-10-CM

## 2017-05-05 DIAGNOSIS — D62 Acute posthemorrhagic anemia: Secondary | ICD-10-CM | POA: Diagnosis not present

## 2017-05-05 LAB — CBC WITH DIFFERENTIAL/PLATELET
BASOS ABS: 0 10*3/uL (ref 0.0–0.1)
Basophils Relative: 0 %
EOS PCT: 0 %
Eosinophils Absolute: 0 10*3/uL (ref 0.0–0.7)
HEMATOCRIT: 39.5 % (ref 39.0–52.0)
Hemoglobin: 13.1 g/dL (ref 13.0–17.0)
LYMPHS ABS: 1.3 10*3/uL (ref 0.7–4.0)
LYMPHS PCT: 8 %
MCH: 29 pg (ref 26.0–34.0)
MCHC: 33.2 g/dL (ref 30.0–36.0)
MCV: 87.4 fL (ref 78.0–100.0)
MONO ABS: 1.7 10*3/uL — AB (ref 0.1–1.0)
Monocytes Relative: 11 %
NEUTROS ABS: 12.9 10*3/uL — AB (ref 1.7–7.7)
Neutrophils Relative %: 81 %
Platelets: 323 10*3/uL (ref 150–400)
RBC: 4.52 MIL/uL (ref 4.22–5.81)
RDW: 13.8 % (ref 11.5–15.5)
WBC: 16 10*3/uL — AB (ref 4.0–10.5)

## 2017-05-05 LAB — COMPREHENSIVE METABOLIC PANEL
ALT: 18 U/L (ref 17–63)
AST: 26 U/L (ref 15–41)
Albumin: 3.4 g/dL — ABNORMAL LOW (ref 3.5–5.0)
Alkaline Phosphatase: 72 U/L (ref 38–126)
Anion gap: 10 (ref 5–15)
BILIRUBIN TOTAL: 0.9 mg/dL (ref 0.3–1.2)
BUN: 21 mg/dL — AB (ref 6–20)
CALCIUM: 9.4 mg/dL (ref 8.9–10.3)
CO2: 26 mmol/L (ref 22–32)
CREATININE: 0.79 mg/dL (ref 0.61–1.24)
Chloride: 103 mmol/L (ref 101–111)
Glucose, Bld: 172 mg/dL — ABNORMAL HIGH (ref 65–99)
Potassium: 4.3 mmol/L (ref 3.5–5.1)
Sodium: 139 mmol/L (ref 135–145)
TOTAL PROTEIN: 6.6 g/dL (ref 6.5–8.1)

## 2017-05-05 NOTE — ED Notes (Signed)
Pt is a resident from [facility] that fell this am, staff had performed a xray prior to arrival in er of left hip that showed positive hip fracture, pt is confused upon arrival to er which according to paperwork and ems is pt's baseline. Pain noted with movement of left hip, positive pedal pulse noted,

## 2017-05-05 NOTE — ED Notes (Signed)
Pt resting will arouse when spoken to,

## 2017-05-05 NOTE — ED Notes (Signed)
Pt updated on plan of care,  

## 2017-05-05 NOTE — Progress Notes (Signed)
Called about [facility]chanteric [facility].  Patient would benefit from cephalomedullary nailing of fracture and will be posted for 05/06/17 pending transfer to [facility] and medical clearance.  Will talk with family about the surgery in the morning.

## 2017-05-05 NOTE — ED Provider Notes (Signed)
[facility] EMERGENCY DEPARTMENT Provider Note   CSN: [id number] Arrival date & time: 05/05/17  2054     History   Chief Complaint Chief Complaint  Patient presents with  . Hip Injury    HPI Jonathan Valencia is a 83 y.o. male.   Hip Pain  This is a new problem. The current episode started 1 to 2 hours ago. The problem occurs constantly. The problem has been resolved. Nothing aggravates the symptoms. Nothing relieves the symptoms. He has tried nothing for the symptoms. The treatment provided no relief.    Past Medical History:  Diagnosis Date  . Dementia   . Hypertension     Patient Active Problem List   Diagnosis Date Noted  . Pressure injury of skin 12/07/2016  . Acute metabolic encephalopathy 12/07/2016  . Unresponsive episode   . Hypotension 12/06/2016  . Closed intertrochanteric fracture, right, initial encounter (HCC)   . Hip fracture (HCC) 08/08/2016  . Fracture of greater trochanter of right femur (HCC) 08/07/2016  . Dementia 08/07/2016  . Falls frequently 08/07/2016  . Right hip pain 08/07/2016    Past Surgical History:  Procedure Laterality Date  . HERNIA REPAIR    . INTRAMEDULLARY (IM) NAIL INTERTROCHANTERIC Right 08/09/2016   Procedure: INTERNAL FIXATION RIGHT HIP;  Surgeon: Vickki HearingStanley E Harrison, MD;  Location: [facility];  Service: Orthopedics;  Laterality: Right;       Home Medications    Prior to Admission medications   Medication Sig Start Date End Date Taking? Authorizing Provider  cholecalciferol (VITAMIN D) 1000 units tablet Take 2,000 Units by mouth daily.    Yes [provider]  divalproex (DEPAKOTE SPRINKLE) 125 MG capsule Take 125 mg by mouth 2 (two) times daily.    Yes [provider]  donepezil (ARICEPT) 10 MG tablet Take 10 mg by mouth daily.   Yes [provider]  Multiple Vitamin (MULTIVITAMIN WITH MINERALS) TABS tablet Take 1 tablet by mouth daily.   Yes [provider]  traZODone (DESYREL) 50 MG  tablet Take 50 mg by mouth at bedtime.    Yes [provider]    Family History No family history on file.  Social History Social History  Substance Use Topics  . Smoking status: Never Smoker  . Smokeless tobacco: Never Used  . Alcohol use No     Allergies   Patient has no known allergies.   Review of Systems Review of Systems  All other systems reviewed and are negative.    Physical Exam Updated Vital Signs BP 111/82   Pulse 82   Temp 98.5 F (36.9 C) (Oral)   Resp (!) 21   Ht 5\' 5"  (1.651 m)   Wt 83.9 kg (185 lb)   SpO2 100%   BMI 30.79 kg/m   Physical Exam  Constitutional: He appears well-developed and well-nourished.  HENT:  Head: Normocephalic and atraumatic.  Eyes: Conjunctivae and EOM are normal.  Neck: Normal range of motion.  Cardiovascular: Normal rate.   Pulmonary/Chest: Effort normal. No respiratory distress.  Abdominal: Soft. He exhibits no distension.  Musculoskeletal: Normal range of motion. He exhibits tenderness (with ROM of L hip).  Neurological: He is alert.  Skin: Skin is warm and dry.  Nursing note and vitals reviewed.    ED Treatments / Results  Labs (all labs ordered are listed, but only abnormal results are displayed) Labs Reviewed  CBC WITH DIFFERENTIAL/PLATELET - Abnormal; Notable for the following:       Result Value  WBC 16.0 (*)    Neutro Abs 12.9 (*)    Monocytes Absolute 1.7 (*)    All other components within normal limits  COMPREHENSIVE METABOLIC PANEL - Abnormal; Notable for the following:    Glucose, Bld 172 (*)    BUN 21 (*)    Albumin 3.4 (*)    All other components within normal limits    EKG  EKG Interpretation  Date/Time:  Friday May 05 2017 21:48:43 EDT Ventricular Rate:  86 PR Interval:    QRS Duration: 76 QT Interval:  354 QTC Calculation: 424 R Axis:   33 Text Interpretation:  Sinus rhythm Abnormal R-wave progression, early transition No significant change since last tracing  Confirmed by Marily Memos [phone]) on 05/06/2017 12:00:05 AM       Radiology Dg Chest 1 View  Result Date: 05/05/2017 CLINICAL DATA:  Fall with pain on the left side EXAM: CHEST 1 VIEW COMPARISON:  12/06/2016 FINDINGS: Shallow inspiration. Heart size and pulmonary vascularity are normal. Prominence of right hilum which is most likely due to tortuous vessels but this is more prominent than on previous studies and CT is suggested to exclude hilar lymphadenopathy. Lungs are clear. No blunting of costophrenic angles. No pneumothorax. Tortuous aorta. IMPRESSION: Prominent soft tissue in the right hilum probably relates to tortuous vessels with shallow inspiration but this is more prominent than on previous study and CT is suggested to exclude hilar lymphadenopathy. No evidence of active pulmonary disease. Electronically Signed   By: Burman Nieves M.D.   On: 05/05/2017 21:39   Dg Hip Unilat W Or Wo Pelvis 2-3 Views Left  Result Date: 05/05/2017 CLINICAL DATA:  Fall with hip pain EXAM: DG HIP (WITH OR WITHOUT PELVIS) 2-3V LEFT COMPARISON:  11/04/2016, 10/03/2016 FINDINGS: Status post right hip replacement with old trochanteric fracture deformity. Pubic symphysis and rami are intact. Acute left intertrochanteric fracture with displaced lesser trochanteric fracture fragment. No dislocation. IMPRESSION: 1. Acute displaced left intertrochanteric fracture 2. Status post intramedullary rodding of the right femur across old right intertrochanteric fracture Electronically Signed   By: Jasmine Pang M.D.   On: 05/05/2017 21:39    Procedures Procedures (including critical care time)  Medications Ordered in ED Medications - No data to display   Initial Impression / Assessment and Plan / ED Course  I have reviewed the triage vital signs and the nursing notes.  Pertinent labs & imaging results that were available during my care of the patient were reviewed by me and considered in my medical decision making  (see chart for details).  L hip fracture from an unwitnessed fall. Discussed with Dr. Everardo Pacific at [facility] who will look at films and consider for operation tomorrow. Discussed with Dr. Sharl Ma with [facility] who [facility].   Final Clinical Impressions(s) / ED Diagnoses   Final diagnoses:  Closed fracture of left hip, initial encounter [facility])     Edrei Norgaard, Barbara Cower, MD 05/06/17 0001

## 2017-05-05 NOTE — ED Notes (Signed)
Pt returned from xray

## 2017-05-05 NOTE — ED Notes (Signed)
Patient transported to CT 

## 2017-05-05 NOTE — ED Notes (Signed)
Pt's wallet found in blanket that pt was wrapped up in, wallet placed on counter in room,

## 2017-05-05 NOTE — ED Triage Notes (Signed)
Sent from [facility]  Fall at 1100 this am, Xray there shows a fx hip

## 2017-05-06 ENCOUNTER — Encounter (HOSPITAL_COMMUNITY)
Admission: EM | Disposition: A | Discharge status: Skilled Nursing Facility | Payer: Self-pay | Source: Home / Self Care | Attending: Internal Medicine

## 2017-05-06 ENCOUNTER — Inpatient Hospital Stay (HOSPITAL_COMMUNITY): Payer: Medicare Other | Admitting: Anesthesiology

## 2017-05-06 ENCOUNTER — Inpatient Hospital Stay (HOSPITAL_COMMUNITY): Payer: Medicare Other

## 2017-05-06 ENCOUNTER — Encounter (HOSPITAL_COMMUNITY): Payer: Self-pay | Admitting: Anesthesiology

## 2017-05-06 DIAGNOSIS — S72002A Fracture of unspecified part of neck of left femur, initial encounter for closed fracture: Secondary | ICD-10-CM

## 2017-05-06 DIAGNOSIS — Z79899 Other long term (current) drug therapy: Secondary | ICD-10-CM | POA: Diagnosis not present

## 2017-05-06 DIAGNOSIS — F0390 Unspecified dementia without behavioral disturbance: Secondary | ICD-10-CM | POA: Diagnosis not present

## 2017-05-06 DIAGNOSIS — Y92129 Unspecified place in nursing home as the place of occurrence of the external cause: Secondary | ICD-10-CM | POA: Diagnosis not present

## 2017-05-06 DIAGNOSIS — W19XXXA Unspecified fall, initial encounter: Secondary | ICD-10-CM | POA: Diagnosis present

## 2017-05-06 DIAGNOSIS — I10 Essential (primary) hypertension: Secondary | ICD-10-CM | POA: Diagnosis present

## 2017-05-06 DIAGNOSIS — M810 Age-related osteoporosis without current pathological fracture: Secondary | ICD-10-CM | POA: Diagnosis present

## 2017-05-06 DIAGNOSIS — D62 Acute posthemorrhagic anemia: Secondary | ICD-10-CM | POA: Diagnosis not present

## 2017-05-06 DIAGNOSIS — S72142A Displaced intertrochanteric fracture of left femur, initial encounter for closed fracture: Secondary | ICD-10-CM | POA: Diagnosis present

## 2017-05-06 DIAGNOSIS — E860 Dehydration: Secondary | ICD-10-CM | POA: Diagnosis present

## 2017-05-06 HISTORY — PX: INTRAMEDULLARY (IM) NAIL INTERTROCHANTERIC: SHX5875

## 2017-05-06 LAB — BASIC METABOLIC PANEL
ANION GAP: 7 (ref 5–15)
BUN: 18 mg/dL (ref 6–20)
CALCIUM: 9 mg/dL (ref 8.9–10.3)
CO2: 26 mmol/L (ref 22–32)
Chloride: 104 mmol/L (ref 101–111)
Creatinine, Ser: 0.74 mg/dL (ref 0.61–1.24)
GLUCOSE: 150 mg/dL — AB (ref 65–99)
Potassium: 3.9 mmol/L (ref 3.5–5.1)
SODIUM: 137 mmol/L (ref 135–145)

## 2017-05-06 LAB — CBC
HCT: 37.3 % — ABNORMAL LOW (ref 39.0–52.0)
Hemoglobin: 12.3 g/dL — ABNORMAL LOW (ref 13.0–17.0)
MCH: 28.5 pg (ref 26.0–34.0)
MCHC: 33 g/dL (ref 30.0–36.0)
MCV: 86.3 fL (ref 78.0–100.0)
PLATELETS: 309 10*3/uL (ref 150–400)
RBC: 4.32 MIL/uL (ref 4.22–5.81)
RDW: 13.8 % (ref 11.5–15.5)
WBC: 14 10*3/uL — AB (ref 4.0–10.5)

## 2017-05-06 LAB — SURGICAL PCR SCREEN
MRSA, PCR: NEGATIVE
Staphylococcus aureus: NEGATIVE

## 2017-05-06 SURGERY — FIXATION, FRACTURE, INTERTROCHANTERIC, WITH INTRAMEDULLARY ROD
Anesthesia: Spinal | Site: Hip | Laterality: Left

## 2017-05-06 MED ORDER — FENTANYL CITRATE (PF) 100 MCG/2ML IJ SOLN
INTRAMUSCULAR | Status: DC | PRN
  Administered 2017-05-06: 50 ug via INTRAVENOUS
  Administered 2017-05-06: 25 ug via INTRAVENOUS

## 2017-05-06 MED ORDER — LACTATED RINGERS IV SOLN
INTRAVENOUS | Status: DC
  Administered 2017-05-06 – 2017-05-07 (×2): via INTRAVENOUS

## 2017-05-06 MED ORDER — EPHEDRINE SULFATE-NACL 50-0.9 MG/10ML-% IV SOSY
PREFILLED_SYRINGE | INTRAVENOUS | Status: DC | PRN
  Administered 2017-05-06 (×2): 10 mg via INTRAVENOUS

## 2017-05-06 MED ORDER — PROPOFOL 500 MG/50ML IV EMUL
INTRAVENOUS | Status: DC | PRN
  Administered 2017-05-06: 20 ug/kg/min via INTRAVENOUS

## 2017-05-06 MED ORDER — CEFAZOLIN SODIUM-DEXTROSE 2-4 GM/100ML-% IV SOLN
INTRAVENOUS | Status: AC
  Filled 2017-05-06: qty 100

## 2017-05-06 MED ORDER — MORPHINE SULFATE (PF) 4 MG/ML IV SOLN
0.5000 mg | INTRAVENOUS | Status: DC | PRN
  Filled 2017-05-06: qty 1

## 2017-05-06 MED ORDER — ENOXAPARIN SODIUM 40 MG/0.4ML ~~LOC~~ SOLN: 40.0000 mg | SUBCUTANEOUS | 0 refills | Status: DC

## 2017-05-06 MED ORDER — DONEPEZIL HCL 10 MG PO TABS
10.0000 mg | ORAL_TABLET | Freq: Every day | ORAL | Status: DC
  Administered 2017-05-06 – 2017-05-07 (×2): 10 mg via ORAL
  Filled 2017-05-06 (×2): qty 1

## 2017-05-06 MED ORDER — EPHEDRINE 5 MG/ML INJ
INTRAVENOUS | Status: AC
  Filled 2017-05-06: qty 10

## 2017-05-06 MED ORDER — PHENYLEPHRINE 40 MCG/ML (10ML) SYRINGE FOR IV PUSH (FOR BLOOD PRESSURE SUPPORT)
PREFILLED_SYRINGE | INTRAVENOUS | Status: DC | PRN
  Administered 2017-05-06 (×2): 80 ug via INTRAVENOUS

## 2017-05-06 MED ORDER — SODIUM CHLORIDE 0.9 % IV SOLN
INTRAVENOUS | Status: DC
  Administered 2017-05-06: 14:00:00 via INTRAVENOUS

## 2017-05-06 MED ORDER — PROPOFOL 10 MG/ML IV BOLUS
INTRAVENOUS | Status: AC
Start: 2017-05-06 — End: 2017-05-06
  Filled 2017-05-06: qty 20

## 2017-05-06 MED ORDER — PHENYLEPHRINE 40 MCG/ML (10ML) SYRINGE FOR IV PUSH (FOR BLOOD PRESSURE SUPPORT)
PREFILLED_SYRINGE | INTRAVENOUS | Status: AC
  Filled 2017-05-06: qty 10

## 2017-05-06 MED ORDER — PROPOFOL 1000 MG/100ML IV EMUL
INTRAVENOUS | Status: AC
  Filled 2017-05-06: qty 100

## 2017-05-06 MED ORDER — FENTANYL CITRATE (PF) 100 MCG/2ML IJ SOLN: 25.0000 ug | INTRAMUSCULAR | Status: DC | PRN

## 2017-05-06 MED ORDER — CEFAZOLIN SODIUM-DEXTROSE 1-4 GM/50ML-% IV SOLN
1.0000 g | Freq: Three times a day (TID) | INTRAVENOUS | Status: DC
  Administered 2017-05-06 – 2017-05-07 (×3): 1 g via INTRAVENOUS
  Filled 2017-05-06 (×5): qty 50

## 2017-05-06 MED ORDER — FENTANYL CITRATE (PF) 250 MCG/5ML IJ SOLN
INTRAMUSCULAR | Status: AC
  Filled 2017-05-06: qty 5

## 2017-05-06 MED ORDER — HYDROCODONE-ACETAMINOPHEN 5-325 MG PO TABS: 1.0000 | ORAL_TABLET | Freq: Four times a day (QID) | ORAL | 0 refills | Status: AC | PRN

## 2017-05-06 MED ORDER — DEXTROSE 5 % IV SOLN
INTRAVENOUS | Status: DC | PRN
  Administered 2017-05-06: 50 ug/min via INTRAVENOUS

## 2017-05-06 MED ORDER — ENSURE ENLIVE PO LIQD
237.0000 mL | Freq: Three times a day (TID) | ORAL | Status: DC
  Administered 2017-05-07 (×2): 237 mL via ORAL

## 2017-05-06 MED ORDER — ONDANSETRON HCL 4 MG/2ML IJ SOLN
INTRAMUSCULAR | Status: DC | PRN
  Administered 2017-05-06: 4 mg via INTRAVENOUS

## 2017-05-06 MED ORDER — DIVALPROEX SODIUM 125 MG PO CSDR
125.0000 mg | DELAYED_RELEASE_CAPSULE | Freq: Two times a day (BID) | ORAL | Status: DC
  Administered 2017-05-06 – 2017-05-07 (×3): 125 mg via ORAL
  Filled 2017-05-06 (×4): qty 1

## 2017-05-06 MED ORDER — ONDANSETRON HCL 4 MG/2ML IJ SOLN
INTRAMUSCULAR | Status: AC
  Filled 2017-05-06: qty 2

## 2017-05-06 MED ORDER — HYDROCODONE-ACETAMINOPHEN 5-325 MG PO TABS
1.0000 | ORAL_TABLET | Freq: Four times a day (QID) | ORAL | Status: DC | PRN
  Administered 2017-05-06 – 2017-05-07 (×3): 1 via ORAL
  Filled 2017-05-06 (×3): qty 1

## 2017-05-06 MED ORDER — BOOST / RESOURCE BREEZE PO LIQD
1.0000 | Freq: Two times a day (BID) | ORAL | Status: AC
  Administered 2017-05-06: 1 via ORAL

## 2017-05-06 MED ORDER — CEFAZOLIN SODIUM-DEXTROSE 2-4 GM/100ML-% IV SOLN
2.0000 g | Freq: Once | INTRAVENOUS | Status: AC
  Administered 2017-05-06: 2 g via INTRAVENOUS

## 2017-05-06 MED ORDER — HEPARIN SODIUM (PORCINE) 5000 UNIT/ML IJ SOLN
5000.0000 [IU] | Freq: Three times a day (TID) | INTRAMUSCULAR | Status: DC
  Administered 2017-05-06 – 2017-05-07 (×3): 5000 [IU] via SUBCUTANEOUS
  Filled 2017-05-06 (×3): qty 1

## 2017-05-06 SURGICAL SUPPLY — 38 items
BIT DRILL AO GAMMA 4.2X180 (BIT) ×3 IMPLANT
BNDG COHESIVE 4X5 TAN STRL (GAUZE/BANDAGES/DRESSINGS) ×3 IMPLANT
BNDG GAUZE ELAST 4 BULKY (GAUZE/BANDAGES/DRESSINGS) ×3 IMPLANT
COVER PERINEAL POST (MISCELLANEOUS) ×3 IMPLANT
COVER SURGICAL LIGHT HANDLE (MISCELLANEOUS) ×3 IMPLANT
DRAPE STERI IOBAN 125X83 (DRAPES) ×3 IMPLANT
DRSG MEPILEX BORDER 4X4 (GAUZE/BANDAGES/DRESSINGS) ×3 IMPLANT
DURAPREP 26ML APPLICATOR (WOUND CARE) ×3 IMPLANT
ELECT REM PT RETURN 9FT ADLT (ELECTROSURGICAL) ×3
ELECTRODE REM PT RTRN 9FT ADLT (ELECTROSURGICAL) ×1 IMPLANT
GAUZE XEROFORM 1X8 LF (GAUZE/BANDAGES/DRESSINGS) ×3 IMPLANT
GLOVE BIO SURGEON STRL SZ7.5 (GLOVE) ×6 IMPLANT
GLOVE BIOGEL PI IND STRL 8 (GLOVE) ×2 IMPLANT
GLOVE BIOGEL PI INDICATOR 8 (GLOVE) ×4
GOWN STRL REUS W/ TWL LRG LVL3 (GOWN DISPOSABLE) ×3 IMPLANT
GOWN STRL REUS W/TWL LRG LVL3 (GOWN DISPOSABLE) ×6
GUIDEROD T2 3X1000 (ROD) ×3 IMPLANT
K-WIRE  3.2X450M STR (WIRE) ×4
K-WIRE 3.2X450M STR (WIRE) ×2
KIT NAIL LONG 10X360MMX125 (Nail) ×3 IMPLANT
KIT ROOM TURNOVER OR (KITS) ×3 IMPLANT
KWIRE 3.2X450M STR (WIRE) ×2 IMPLANT
MANIFOLD NEPTUNE II (INSTRUMENTS) ×3 IMPLANT
NS IRRIG 1000ML POUR BTL (IV SOLUTION) ×3 IMPLANT
PACK GENERAL/GYN (CUSTOM PROCEDURE TRAY) ×3 IMPLANT
PAD ARMBOARD 7.5X6 YLW CONV (MISCELLANEOUS) ×3 IMPLANT
PAD CAST 4YDX4 CTTN HI CHSV (CAST SUPPLIES) ×1 IMPLANT
PADDING CAST COTTON 4X4 STRL (CAST SUPPLIES) ×2
REAMER SHAFT BIXCUT (INSTRUMENTS) ×3 IMPLANT
SCREW LAG GAMMA 3 95MM (Screw) ×3 IMPLANT
SCREW LOCKING T2 F/T  5X37.5MM (Screw) ×2 IMPLANT
SCREW LOCKING T2 F/T 5X37.5MM (Screw) ×1 IMPLANT
SUT MNCRL AB 4-0 PS2 18 (SUTURE) IMPLANT
SUT MON AB 2-0 CT1 27 (SUTURE) ×3 IMPLANT
SUT VIC AB 0 CT1 27 (SUTURE) ×2
SUT VIC AB 0 CT1 27XBRD ANBCTR (SUTURE) ×1 IMPLANT
TOWEL OR 17X24 6PK STRL BLUE (TOWEL DISPOSABLE) ×3 IMPLANT
TOWEL OR 17X26 10 PK STRL BLUE (TOWEL DISPOSABLE) ×3 IMPLANT

## 2017-05-06 NOTE — Progress Notes (Signed)
Initial Nutrition Assessment  DOCUMENTATION CODES:  Not applicable  INTERVENTION:  Patient needs at least setup assistance at meals, potentially may be unable to feed self. Appreciative of nursing assistance.  Ensure Enlive po TID, each supplement provides 350 kcal and 20 grams of protein  Milk with meals  Please obtain re-weight when possible  NUTRITION DIAGNOSIS:  Inadequate oral intake related to acute illness (hip fx), chronic illness (dementia), poor appetite, lethargy/confusion (dementia) as evidenced by per the daughters report  GOAL:  Patient will meet greater than or equal to 90% of their needs  MONITOR:  PO intake, Supplement acceptance, Diet advancement, Labs, Weight trends  REASON FOR ASSESSMENT:  Consult Hip fracture protocol  ASSESSMENT:  83 y/o male PMHx dementia, HTN, frequent falls, R hip fx s/p internal fixation 1/18. [facility]ailing.  Patient is in OR on RD arrival, however, pt's daughter is in room. She is a Producer, television/[employer] employee and an excellent historian and provides all information.  She states that prior to the patients Hip fx in January, he was doing well. He had a good appetite and was fairly independent.  After he suffered his past hip fx, he was transferred to a rehab facility. He did poorly there secondary to his dementia and lack of needed assisstance.   He was briefly on a D2 diet. He didn't do too well with this. Also, Daughter reports the staff there was unfamiliar with how to care for people with dementia and did not assist him at all with his meals. She notes she frequently would find his untouched meal trays in his room. Also, the SNF did not provide him with supplements that he received when he lived with daughter.   She knows he has lost weight because the looks much thinner to her. She says his current documented weight is assuredly incorrect and estimates he is weighing 98-99 lbs right now.   NFPE:  unable to conduct- Pt is in OR  Daughter notes that the patient is extremely confused at this time and that he did not even know his own name when he went to OR. She suspects he will need much assistance at meals. RD took note that the pt "loves milk" and will drink this nonstop. Will add this to trays and order ENsure TID.   Labs: WBC: 14, Glu; 150-170, Albumin:3.4,  Meds: Aricept, IV abx, Depakote  Diet Order:  Diet NPO time specified Except for: Sips with Meds  Skin: Abrasion to back  Last BM:  10/20  Height:  Ht Readings from Last 1 Encounters:  05/05/17 5\' 5"  (1.651 m)   Weight:  Wt Readings from Last 1 Encounters:  05/06/17 99 lb (44.9 kg)   Wt Readings from Last 10 Encounters:  05/06/17 99 lb (44.9 kg)  12/06/16 115 lb 1.3 oz (52.2 kg)  08/09/16 109 lb (49.4 kg)  08/01/16 135 lb (61.2 kg)  07/25/16 135 lb (61.2 kg)   Ideal Body Weight:  59.1 kg  BMI:  Body mass index is 16.47 kg/m.  Estimated Nutritional Needs:  Kcal:  1550-1800 kcals (35-40 kcal/kg bw) Protein:  60-72g Pro (1.4-1.6 g/kg bw) Fluid:  >1.4 L fluid (30 ml/kg bw)  EDUCATION NEEDS:  No education needs identified at this time  Christophe LouisNathan Franks RD, LDN, CNSC Clinical Nutrition Pager: [phone] 05/06/2017 12:25 PM

## 2017-05-06 NOTE — ED Notes (Signed)
Dr Lama at bedside,  

## 2017-05-06 NOTE — Interval H&P Note (Signed)
History and Physical Interval Note:  05/06/2017 10:25 AM  Jonathan HugerLawrence Shrieves  has presented today for surgery, with the diagnosis of left intertrochanteric femur fracture  The various methods of treatment have been discussed with the patient and family. After consideration of risks, benefits and other options for treatment, the patient has consented to  Procedure(s): INTRAMEDULLARY (IM) NAIL INTERTROCHANTRIC (Left) as a surgical intervention .  The patient's history has been reviewed, patient examined, no change in status, stable for surgery.  I have reviewed the patient's chart and labs.  Questions were answered to the patient's satisfaction.    I discussed the care plan with the medical team as well as with the patient's daughter.  She understands the risks and benefits of surgery and the reasons that the patient is high risk but yet would likely be better served with surgery then with nonoperative measures.   Bjorn Pippinax T Varkey

## 2017-05-06 NOTE — Progress Notes (Signed)
[facility]  Patient admitted overnight by my colleague. I have examined him today and spoken with Orthopedic surgery.  Jonathan Valencia  is a 83 y.o. male with history of dementia, hypertension who was brought to ED from skilled facility after an  unwitnessed fall. X-ray of the hip at the facility showed L hip fracture and patient was transferred to ED for further evaluation. X-ray done the ED confirms left intertrochanteric hip fracture. ED physician called and discussed with Dr Everardo PacificVarkey, who recommended patient to transfer to [facility].  Principal Problem:   Closed left hip fracture  - has undergone IM nail today - management per ortho - f/u Hb and Pt eval  Active Problems: Dehydrated - slow IVF today    Dementia - quite severe- cont Aricept, Depakote  Right hilar fullness - need CT for further evaluation  Calvert CantorSaima Shabazz Mckey, MD

## 2017-05-06 NOTE — Progress Notes (Signed)
Daughter [name] called to check on pt (father).  Report given to daughter that pt was resting comfortably in bed and was given pain medication as pt was able to express and point to where he was having pain.  Pt pointed to surgical site.  Pt being monitored closely.  No issues to report at this time.  Daughter thankful for returned phone call.

## 2017-05-06 NOTE — Anesthesia Procedure Notes (Signed)
Procedure Name: MAC Date/Time: 05/06/2017 11:20 AM Performed by: Kyung Rudd Pre-anesthesia Checklist: Patient identified, Emergency Drugs available, Suction available and Patient being monitored Patient Re-evaluated:Patient Re-evaluated prior to induction Oxygen Delivery Method: Simple face mask Preoxygenation: Pre-oxygenation with 100% oxygen Induction Type: IV induction Placement Confirmation: positive ETCO2 Dental Injury: Teeth and Oropharynx as per pre-operative assessment

## 2017-05-06 NOTE — Anesthesia Preprocedure Evaluation (Addendum)
Anesthesia Evaluation  Patient identified by MRN, date of birth, ID band Patient confused    Reviewed: Allergy & Precautions, NPO status , Patient's Chart, lab work & pertinent test results  History of Anesthesia Complications Negative for: history of anesthetic complications  Airway Mallampati: II  TM Distance: >3 FB Neck ROM: Full    Dental  (+) Edentulous Upper, Edentulous Lower   Pulmonary neg pulmonary ROS,    Pulmonary exam normal        Cardiovascular hypertension, Pt. on medications Normal cardiovascular exam     Neuro/Psych PSYCHIATRIC DISORDERS (dementia hx, answers questions appropriately) Dementia    GI/Hepatic negative GI ROS, Neg liver ROS,   Endo/Other    Renal/GU      Musculoskeletal   Abdominal   Peds  Hematology   Anesthesia Other Findings   Reproductive/Obstetrics                            Anesthesia Physical  Anesthesia Plan  ASA: III  Anesthesia Plan: Spinal   Post-op Pain Management:    Induction:   PONV Risk Score and Plan: 2 and Ondansetron and Propofol infusion  Airway Management Planned: Simple Face Mask  Additional Equipment:   Intra-op Plan:   Post-operative Plan:   Informed Consent: I have reviewed the patients History and Physical, chart, labs and discussed the procedure including the risks, benefits and alternatives for the proposed anesthesia with the patient or authorized representative who has indicated his/her understanding and acceptance.   Dental advisory given and Consent reviewed with POA  Plan Discussed with: CRNA and Anesthesiologist  Anesthesia Plan Comments:        Anesthesia Quick Evaluation

## 2017-05-06 NOTE — Progress Notes (Signed)
Orthopedic Tech Progress Note Patient Details:  Oley Schaben 12/3Vickey Huger0/1928 [id number]  Ortho Devices Ortho Device/Splint Location: Unable to use frame properly   Saul FordyceJennifer C Mayan Kloepfer 05/06/2017, 12:19 PM

## 2017-05-06 NOTE — H&P (Addendum)
[facility] H&P    Patient Demographics:    Jonathan Valencia, is a 83 y.o. male  MRN: [id number]  DOB - 13-Apr-1927  Admit Date - 05/05/2017  Referring MD/NP/PA: Dr Clayborne Dana  Outpatient Primary MD for the patient is System, Provider Not In  Patient coming from: Skilled facility Chief Complaint  Patient presents with  . Hip Injury      HPI:    Jonathan Valencia  is a 83 y.o. male, with history of dementia, hypertension who is a poor historian, was brought to ED from skilled facility after an  unwitnessed fall, x-ray of the hip at the facility showed hip fracture and patient was transferred to ED for further evaluation. X-ray done the ED confirms left intertrochanteric hip fracture. ED physician called and discussed with Dr Everardo Pacific, who recommended patient to transfer to [facility]. No other history obtainable.    Review of systems:    Unobtainable as patient is poor historian, history of dementia.  With Past History of the following :    Past Medical History:  Diagnosis Date  . Dementia   . Hypertension       Past Surgical History:  Procedure Laterality Date  . HERNIA REPAIR    . INTRAMEDULLARY (IM) NAIL INTERTROCHANTERIC Right 08/09/2016   Procedure: INTERNAL FIXATION RIGHT HIP;  Surgeon: Vickki Hearing, MD;  Location: [facility];  Service: Orthopedics;  Laterality: Right;      Social History:      Social History  Substance Use Topics  . Smoking status: Never Smoker  . Smokeless tobacco: Never Used  . Alcohol use No       Family History :   Unobtainable at this time, patient has underlying dementia   Home Medications:   Prior to Admission medications   Medication Sig Start Date End Date Taking? Authorizing Provider  cholecalciferol (VITAMIN D) 1000 units tablet Take 2,000 Units by mouth daily.    Yes [provider]  divalproex (DEPAKOTE SPRINKLE) 125 MG capsule Take 125 mg by  mouth 2 (two) times daily.    Yes [provider]  donepezil (ARICEPT) 10 MG tablet Take 10 mg by mouth daily.   Yes [provider]  Multiple Vitamin (MULTIVITAMIN WITH MINERALS) TABS tablet Take 1 tablet by mouth daily.   Yes [provider]  traZODone (DESYREL) 50 MG tablet Take 50 mg by mouth at bedtime.    Yes [provider]     Allergies:    No Known Allergies   Physical Exam:   Vitals  Blood pressure 115/81, pulse 76, temperature 98.5 F (36.9 C), temperature source Oral, resp. rate 20, height 5\' 5"  (1.651 m), weight 83.9 kg (185 lb), SpO2 95 %.  1.  General: Appears in no acute distress  2. Psychiatric:  Awake, alert, not oriented 3  3. Neurologic: Moving all extremities  4. Eyes :  anicteric sclerae, moist conjunctivae with no lid lag. PERRLA.  5. ENMT:  Oropharynx clear with moist mucous membranes and good dentition  6. Neck:  supple, no cervical  lymphadenopathy appriciated, No thyromegaly  7. Respiratory : Normal respiratory effort, good air movement bilaterally,clear to  auscultation bilaterally  8. Cardiovascular : RRR, no gallops, rubs or murmurs, no leg edema  9. Gastrointestinal:  Positive bowel sounds, abdomen soft, non-tender to palpation,no hepatosplenomegaly, no rigidity or guarding       10. Skin:  No cyanosis, normal texture and turgor, no rash, lesions or ulcers  11.Musculoskeletal:  Good muscle tone,  joints appear normal , no effusions,  normal range of motion    Data Review:    CBC  Recent Labs Lab 05/05/17 2131  WBC 16.0*  HGB 13.1  HCT 39.5  PLT 323  MCV 87.4  MCH 29.0  MCHC 33.2  RDW 13.8  LYMPHSABS 1.3  MONOABS 1.7*  EOSABS 0.0  BASOSABS 0.0   ------------------------------------------------------------------------------------------------------------------  Chemistries   Recent Labs Lab 05/05/17 2131  NA 139  K 4.3  CL 103  CO2 26  GLUCOSE 172*  BUN 21*  CREATININE  0.79  CALCIUM 9.4  AST 26  ALT 18  ALKPHOS 72  BILITOT 0.9   ------------------------------------------------------------------------------------------------------------------  ------------------------------------------------------------------------------------------------------------------ GFR: Estimated Creatinine Clearance: 64.9 mL/min (by C-G formula based on SCr of 0.79 mg/dL). Liver Function Tests:  Recent Labs Lab 05/05/17 2131  AST 26  ALT 18  ALKPHOS 72  BILITOT 0.9  PROT 6.6  ALBUMIN 3.4*   --------------------------------------------------------------------------------------------------------------- Urine analysis:    Component Value Date/Time   COLORURINE YELLOW 12/06/2016 1010   APPEARANCEUR CLEAR 12/06/2016 1010   LABSPEC 1.016 12/06/2016 1010   PHURINE 6.0 12/06/2016 1010   GLUCOSEU NEGATIVE 12/06/2016 1010   HGBUR NEGATIVE 12/06/2016 1010   BILIRUBINUR NEGATIVE 12/06/2016 1010   KETONESUR NEGATIVE 12/06/2016 1010   PROTEINUR NEGATIVE 12/06/2016 1010   NITRITE NEGATIVE 12/06/2016 1010   LEUKOCYTESUR NEGATIVE 12/06/2016 1010      Imaging Results:    Dg Chest 1 View  Result Date: 05/05/2017 CLINICAL DATA:  Fall with pain on the left side EXAM: CHEST 1 VIEW COMPARISON:  12/06/2016 FINDINGS: Shallow inspiration. Heart size and pulmonary vascularity are normal. Prominence of right hilum which is most likely due to tortuous vessels but this is more prominent than on previous studies and CT is suggested to exclude hilar lymphadenopathy. Lungs are clear. No blunting of costophrenic angles. No pneumothorax. Tortuous aorta. IMPRESSION: Prominent soft tissue in the right hilum probably relates to tortuous vessels with shallow inspiration but this is more prominent than on previous study and CT is suggested to exclude hilar lymphadenopathy. No evidence of active pulmonary disease. Electronically Signed   By: Burman Nieves M.D.   On: 05/05/2017 21:39   Dg Hip  Unilat W Or Wo Pelvis 2-3 Views Left  Result Date: 05/05/2017 CLINICAL DATA:  Fall with hip pain EXAM: DG HIP (WITH OR WITHOUT PELVIS) 2-3V LEFT COMPARISON:  11/04/2016, 10/03/2016 FINDINGS: Status post right hip replacement with old trochanteric fracture deformity. Pubic symphysis and rami are intact. Acute left intertrochanteric fracture with displaced lesser trochanteric fracture fragment. No dislocation. IMPRESSION: 1. Acute displaced left intertrochanteric fracture 2. Status post intramedullary rodding of the right femur across old right intertrochanteric fracture Electronically Signed   By: Jasmine Pang M.D.   On: 05/05/2017 21:39    My personal review of EKG: Rhythm NSR   Assessment & Plan:    Active Problems:   Hip fracture (HCC)  1. Hip fracture- x-ray of the hip shows acute displaced left intertrochanteric fracture. Patient will be transferred to [facility]. Orthopedic surgery  Dr Everardo PacificVarkey spoke to ED physician, and he is aware of patient's transfer.  2. Dementia-stable, no behavioral disturbance. Continue Aricept, Depakote.  3. Risk stratification- patient is moderate to severe risk for surgery considering his advanced age.    DVT Prophylaxis-   Heparin  AM Labs Ordered, also please review Full Orders  Family Communication: No family at bedside  Code Status:  Full code             Admission status: Inpatient  Time spent in minutes : 50 minutes   Jonathan Valencia S M.D on 05/06/2017 at 1:18 AM  Between 7am to 7pm - Pager - [phone]. After 7pm go to [email or web address] - password [email or web address]  [facility] - Office  [phone]

## 2017-05-06 NOTE — H&P (View-Only) (Signed)
ORTHOPAEDIC CONSULTATION  REQUESTING PHYSICIAN: Debbe Odea, MD  Chief Complaint: Left hip pain  HPI: Jonathan Valencia is a 83 y.o. male who has significant dementia who had a fall at his SNF reportedly resulting in a left intertrochanteric femur fracture.  Patient transferred from [facility] due to lack of orthopedic coverage.  Patient not able to participate in history secondary to dementia.  Past Medical History:  Diagnosis Date  . Dementia   . Hypertension    Past Surgical History:  Procedure Laterality Date  . HERNIA REPAIR    . INTRAMEDULLARY (IM) NAIL INTERTROCHANTERIC Right 08/09/2016   Procedure: INTERNAL FIXATION RIGHT HIP;  Surgeon: Carole Civil, MD;  Location: [facility];  Service: Orthopedics;  Laterality: Right;   Social History   Social History  . Marital status: Legally Separated    Spouse name: N/A  . Number of children: N/A  . Years of education: N/A   Social History Main Topics  . Smoking status: Never Smoker  . Smokeless tobacco: Never Used  . Alcohol use No  . Drug use: No  . Sexual activity: Not on file   Other Topics Concern  . Not on file   Social History Narrative  . No narrative on file   No family history on file. No Known Allergies Prior to Admission medications   Medication Sig Start Date End Date Taking? Authorizing Provider  cholecalciferol (VITAMIN D) 1000 units tablet Take 2,000 Units by mouth daily.    Yes [provider]  divalproex (DEPAKOTE SPRINKLE) 125 MG capsule Take 125 mg by mouth 2 (two) times daily.    Yes [provider]  donepezil (ARICEPT) 10 MG tablet Take 10 mg by mouth daily.   Yes [provider]  Multiple Vitamin (MULTIVITAMIN WITH MINERALS) TABS tablet Take 1 tablet by mouth daily.   Yes [provider]  traZODone (DESYREL) 50 MG tablet Take 50 mg by mouth at bedtime.    Yes [provider]   Dg Chest 1 View  Result Date: 05/05/2017 CLINICAL DATA:  Fall  with pain on the left side EXAM: CHEST 1 VIEW COMPARISON:  12/06/2016 FINDINGS: Shallow inspiration. Heart size and pulmonary vascularity are normal. Prominence of right hilum which is most likely due to tortuous vessels but this is more prominent than on previous studies and CT is suggested to exclude hilar lymphadenopathy. Lungs are clear. No blunting of costophrenic angles. No pneumothorax. Tortuous aorta. IMPRESSION: Prominent soft tissue in the right hilum probably relates to tortuous vessels with shallow inspiration but this is more prominent than on previous study and CT is suggested to exclude hilar lymphadenopathy. No evidence of active pulmonary disease. Electronically Signed   By: Lucienne Capers M.D.   On: 05/05/2017 21:39   Dg Hip Unilat W Or Wo Pelvis 2-3 Views Left  Result Date: 05/05/2017 CLINICAL DATA:  Fall with hip pain EXAM: DG HIP (WITH OR WITHOUT PELVIS) 2-3V LEFT COMPARISON:  11/04/2016, 10/03/2016 FINDINGS: Status post right hip replacement with old trochanteric fracture deformity. Pubic symphysis and rami are intact. Acute left intertrochanteric fracture with displaced lesser trochanteric fracture fragment. No dislocation. IMPRESSION: 1. Acute displaced left intertrochanteric fracture 2. Status post intramedullary rodding of the right femur across old right intertrochanteric fracture Electronically Signed   By: Donavan Foil M.D.   On: 05/05/2017 21:39   Family History Reviewed and non-contributory, no pertinent history of problems with bleeding or anesthesia      Review of Systems 14  system ROS conducted and negative except for that noted in HPI   OBJECTIVE  Vitals:Patient Vitals for the past 8 hrs:  BP Temp Temp src Pulse Resp SpO2  05/06/17 0512 130/78 98 F (36.7 C) Oral 81 17 100 %  05/06/17 0332 (!) 126/92 98.2 F (36.8 C) Oral 86 18 95 %  05/06/17 0316 129/90 - - - 20 -  05/06/17 0300 127/83 - - - 19 -  05/06/17 0200 118/82 - - - 15 -  05/06/17 0100 115/81 -  - 76 20 95 %  05/06/17 0030 (!) 116/100 - - 77 15 98 %   General: Alert, no acute distress Cardiovascular: No pedal edema Respiratory: No cyanosis, no use of accessory musculature GI: No organomegaly, abdomen is soft and non-tender Skin: No lesions in the area of chief complaint other than those listed below in MSK exam.  Neurologic: Sensation intact distally save for the below mentioned MSK exam Psychiatric: Patient is significantly demented and not oriented Lymphatic: No axillary or cervical lymphadenopathy Extremities   LLE: shortened and externally rotated extremity,  wwp foot, distal motor and sensory testing difficult.  No skin     Test Results Imaging Comminuted 4 part intertrochanteric femur fracture noted on xray, no other pathology on left femur.  Right side demonstrates sequelae of recent IT nail.  Labs cbc  Recent Labs  05/05/17 2131 05/06/17 0628  WBC 16.0* 14.0*  HGB 13.1 12.3*  HCT 39.5 37.3*  PLT 323 309    Labs inflam No results for input(s): CRP in the last 72 hours.  Invalid input(s): ESR  Labs coag No results for input(s): INR, PTT in the last 72 hours.  Invalid input(s): PT   Recent Labs  05/05/17 2131 05/06/17 0628  NA 139 137  K 4.3 3.9  CL 103 104  CO2 26 26  GLUCOSE 172* 150*  BUN 21* 18  CREATININE 0.79 0.74  CALCIUM 9.4 9.0     ASSESSMENT AND PLAN: 83 y.o. male with the following: Left intertrochanteric femur fracture  Discussed with daughter, [name].  Recommend IM nail to treat fracture for mobilization and pain control purposes.  Discussed that medicine has listed patient as higher risk but will clarify if any of these risks are modifiable and if not options would be non-op which predictably will do poorly or surgery.  The risks benefits and alternatives were discussed with the patient including but not limited to the risks of nonoperative treatment, versus surgical intervention including infection, bleeding, nerve injury,  periprosthetic fracture, the need for revision surgery, leg length discrepancy, gait change, blood clots, cardiopulmonary complications, morbidity, mortality, among others, and they were willing to proceed.      - Weight Bearing Status/Activity: NWB currently, WBAT postop  - Additional recommended labs/tests: None  -VTE Prophylaxis: Lovenox starting POD1  - Pain control: favor oral mediciens  - Follow-up plan: 2 weeks postop   -Procedures: pending OR

## 2017-05-06 NOTE — Transfer of Care (Signed)
Immediate Anesthesia Transfer of Care Note  Patient: Jonathan Valencia  Procedure(s) Performed: INTRAMEDULLARY (IM) NAIL INTERTROCHANTRIC (Left Hip)  Patient Location: PACU  Anesthesia Type:Spinal  Level of Consciousness: drowsy  Airway & Oxygen Therapy: Patient Spontanous Breathing and Patient connected to face mask oxygen  Post-op Assessment: Report given to RN and Post -op Vital signs reviewed and stable  Post vital signs: Reviewed and stable  Last Vitals:  Vitals:   05/06/17 0332 05/06/17 0512  BP: (!) 126/92 130/78  Pulse: 86 81  Resp: 18 17  Temp: 36.8 C 36.7 C  SpO2: 95% 100%    Last Pain:  Vitals:   05/06/17 0512  TempSrc: Oral  PainSc:          Complications: No apparent anesthesia complications

## 2017-05-06 NOTE — Anesthesia Procedure Notes (Signed)
Spinal  Patient location during procedure: OR Start time: 05/06/2017 10:49 AM End time: 05/06/2017 10:59 AM Staffing Anesthesiologist: Heather RobertsSINGER, Mikel Pyon Performed: anesthesiologist  Preanesthetic Checklist Completed: patient identified, surgical consent, pre-op evaluation, timeout performed, IV checked, risks and benefits discussed and monitors and equipment checked Spinal Block Patient position: sitting Prep: DuraPrep Patient monitoring: cardiac monitor, continuous pulse ox and blood pressure Approach: midline Location: L2-3 Injection technique: single-shot Needle Needle type: Pencan  Needle gauge: 22 G Needle length: 9 cm Additional Notes Functioning IV was confirmed and monitors were applied. Sterile prep and drape, including hand hygiene and sterile gloves were used. The patient was positioned and the spine was prepped. The skin was anesthetized with lidocaine.  Free flow of clear CSF was obtained prior to injecting local anesthetic into the CSF.  The spinal needle aspirated freely following injection.  The needle was carefully withdrawn.  The patient tolerated the procedure well.

## 2017-05-06 NOTE — Anesthesia Postprocedure Evaluation (Addendum)
Anesthesia Post Note  Patient: Jonathan Valencia  Procedure(s) Performed: INTRAMEDULLARY (IM) NAIL INTERTROCHANTRIC (Left Hip)     Patient location during evaluation: PACU Anesthesia Type: Spinal Level of consciousness: awake Pain management: pain level controlled Vital Signs Assessment: post-procedure vital signs reviewed and stable Respiratory status: spontaneous breathing and respiratory function stable Cardiovascular status: blood pressure returned to baseline and stable Postop Assessment: spinal receding Anesthetic complications: no    Last Vitals:  Vitals:   05/06/17 1345 05/06/17 1350  BP:    Pulse: (!) 106   Resp: 16   Temp:  (!) 36.2 C  SpO2: 100%     Last Pain:  Vitals:   05/06/17 1320  TempSrc:   PainSc: Asleep                 Tiannah Greenly DANIEL

## 2017-05-06 NOTE — ED Notes (Signed)
Report given to Jason with [facility] 

## 2017-05-06 NOTE — Progress Notes (Signed)
[name] has patient's dentures

## 2017-05-06 NOTE — Consult Note (Signed)
ORTHOPAEDIC CONSULTATION  REQUESTING PHYSICIAN: Debbe Odea, MD  Chief Complaint: Left hip pain  HPI: Jonathan Valencia is a 83 y.o. male who has significant dementia who had a fall at his SNF reportedly resulting in a left intertrochanteric femur fracture.  Patient transferred from [facility] due to lack of orthopedic coverage.  Patient not able to participate in history secondary to dementia.  Past Medical History:  Diagnosis Date  . Dementia   . Hypertension    Past Surgical History:  Procedure Laterality Date  . HERNIA REPAIR    . INTRAMEDULLARY (IM) NAIL INTERTROCHANTERIC Right 08/09/2016   Procedure: INTERNAL FIXATION RIGHT HIP;  Surgeon: Carole Civil, MD;  Location: [facility];  Service: Orthopedics;  Laterality: Right;   Social History   Social History  . Marital status: Legally Separated    Spouse name: N/A  . Number of children: N/A  . Years of education: N/A   Social History Main Topics  . Smoking status: Never Smoker  . Smokeless tobacco: Never Used  . Alcohol use No  . Drug use: No  . Sexual activity: Not on file   Other Topics Concern  . Not on file   Social History Narrative  . No narrative on file   No family history on file. No Known Allergies Prior to Admission medications   Medication Sig Start Date End Date Taking? Authorizing Provider  cholecalciferol (VITAMIN D) 1000 units tablet Take 2,000 Units by mouth daily.    Yes [provider]  divalproex (DEPAKOTE SPRINKLE) 125 MG capsule Take 125 mg by mouth 2 (two) times daily.    Yes [provider]  donepezil (ARICEPT) 10 MG tablet Take 10 mg by mouth daily.   Yes [provider]  Multiple Vitamin (MULTIVITAMIN WITH MINERALS) TABS tablet Take 1 tablet by mouth daily.   Yes [provider]  traZODone (DESYREL) 50 MG tablet Take 50 mg by mouth at bedtime.    Yes [provider]   Dg Chest 1 View  Result Date: 05/05/2017 CLINICAL DATA:  Fall  with pain on the left side EXAM: CHEST 1 VIEW COMPARISON:  12/06/2016 FINDINGS: Shallow inspiration. Heart size and pulmonary vascularity are normal. Prominence of right hilum which is most likely due to tortuous vessels but this is more prominent than on previous studies and CT is suggested to exclude hilar lymphadenopathy. Lungs are clear. No blunting of costophrenic angles. No pneumothorax. Tortuous aorta. IMPRESSION: Prominent soft tissue in the right hilum probably relates to tortuous vessels with shallow inspiration but this is more prominent than on previous study and CT is suggested to exclude hilar lymphadenopathy. No evidence of active pulmonary disease. Electronically Signed   By: Lucienne Capers M.D.   On: 05/05/2017 21:39   Dg Hip Unilat W Or Wo Pelvis 2-3 Views Left  Result Date: 05/05/2017 CLINICAL DATA:  Fall with hip pain EXAM: DG HIP (WITH OR WITHOUT PELVIS) 2-3V LEFT COMPARISON:  11/04/2016, 10/03/2016 FINDINGS: Status post right hip replacement with old trochanteric fracture deformity. Pubic symphysis and rami are intact. Acute left intertrochanteric fracture with displaced lesser trochanteric fracture fragment. No dislocation. IMPRESSION: 1. Acute displaced left intertrochanteric fracture 2. Status post intramedullary rodding of the right femur across old right intertrochanteric fracture Electronically Signed   By: Donavan Foil M.D.   On: 05/05/2017 21:39   Family History Reviewed and non-contributory, no pertinent history of problems with bleeding or anesthesia      Review of Systems 14  system ROS conducted and negative except for that noted in HPI   OBJECTIVE  Vitals:Patient Vitals for the past 8 hrs:  BP Temp Temp src Pulse Resp SpO2  05/06/17 0512 130/78 98 F (36.7 C) Oral 81 17 100 %  05/06/17 0332 (!) 126/92 98.2 F (36.8 C) Oral 86 18 95 %  05/06/17 0316 129/90 - - - 20 -  05/06/17 0300 127/83 - - - 19 -  05/06/17 0200 118/82 - - - 15 -  05/06/17 0100 115/81 -  - 76 20 95 %  05/06/17 0030 (!) 116/100 - - 77 15 98 %   General: Alert, no acute distress Cardiovascular: No pedal edema Respiratory: No cyanosis, no use of accessory musculature GI: No organomegaly, abdomen is soft and non-tender Skin: No lesions in the area of chief complaint other than those listed below in MSK exam.  Neurologic: Sensation intact distally save for the below mentioned MSK exam Psychiatric: Patient is significantly demented and not oriented Lymphatic: No axillary or cervical lymphadenopathy Extremities   LLE: shortened and externally rotated extremity,  wwp foot, distal motor and sensory testing difficult.  No skin     Test Results Imaging Comminuted 4 part intertrochanteric femur fracture noted on xray, no other pathology on left femur.  Right side demonstrates sequelae of recent IT nail.  Labs cbc  Recent Labs  05/05/17 2131 05/06/17 0628  WBC 16.0* 14.0*  HGB 13.1 12.3*  HCT 39.5 37.3*  PLT 323 309    Labs inflam No results for input(s): CRP in the last 72 hours.  Invalid input(s): ESR  Labs coag No results for input(s): INR, PTT in the last 72 hours.  Invalid input(s): PT   Recent Labs  05/05/17 2131 05/06/17 0628  NA 139 137  K 4.3 3.9  CL 103 104  CO2 26 26  GLUCOSE 172* 150*  BUN 21* 18  CREATININE 0.79 0.74  CALCIUM 9.4 9.0     ASSESSMENT AND PLAN: 83 y.o. male with the following: Left intertrochanteric femur fracture  Discussed with daughter, [name].  Recommend IM nail to treat fracture for mobilization and pain control purposes.  Discussed that medicine has listed patient as higher risk but will clarify if any of these risks are modifiable and if not options would be non-op which predictably will do poorly or surgery.  The risks benefits and alternatives were discussed with the patient including but not limited to the risks of nonoperative treatment, versus surgical intervention including infection, bleeding, nerve injury,  periprosthetic fracture, the need for revision surgery, leg length discrepancy, gait change, blood clots, cardiopulmonary complications, morbidity, mortality, among others, and they were willing to proceed.      - Weight Bearing Status/Activity: NWB currently, WBAT postop  - Additional recommended labs/tests: None  -VTE Prophylaxis: Lovenox starting POD1  - Pain control: favor oral mediciens  - Follow-up plan: 2 weeks postop   -Procedures: pending OR

## 2017-05-06 NOTE — Op Note (Addendum)
Orthopaedic Surgery Operative Note (CSN: [id number])  Jonathan Valencia  Dec 25, 1926 Date of Surgery: 05/05/2017 - 05/06/2017 Admit Date: 05/05/2017  Diagnoses:  left intertrochanteric femur fracture  Post-Op Diagnosis: Same  Procedure:    INTRAMEDULLARY (IM) NAIL INTERTROCHANTRIC CPT(R) Code:  98338    Operative Finding Successful completion of planned procedure. The 4 part fracture required a derotational wire to be placed to avoid spinning the head.  There was significant comminution but the overall final reduction was appropriate.   Post-operative plan: The patient will be WBAT.  The patient will be admitted back to medicine.  DVT prophylaxis Lovenox 70m qd x4 weeks.  Pain control with PRN pain medication preferring oral medicines.  Follow up plan will be scheduled in approximately 10-14 days for wound check and staple removal.  Surgeons:Primary: VHiram Gash MD  Assistant: LTawanna CoolerPA-C [facility] Anesthesia: General Antibiotics: Ancef 2g preop Tourniquet time: None Estimated Blood Loss: 1250Complications: None Specimens: None Implants:  Implant Name Type Inv. Item Serial No. Manufacturer Lot No. LRB No. Used Action  KIT NAIL LONG [id number] KIT NAIL LONG [id number] STRYKER [employer]UMA [id number] 1 Implanted  SCREW LAG GAMMA 3 95MM - [id number] SCREW LAG GAMMA 3 95MM   STRYKER TRAUMA [id number] Left 1 Implanted    Indications for Surgery:   Jonathan Bolgeris a 83y.o. male with fall and severe dementia as well as osteoporosis.  Discussed options with daugheter at length.  Due to the patients comminuted fracture we warned the family that the outcome was not predictable but non-op treatment would leave the patient bed bound thus we felt that operative management would be more beneficial.  Benefits and risks of operative and nonoperative management were discussed prior to surgery with patient/guardian(s) and informed consent form was  completed.  Specific risks including infection, need for additional surgery, failure of fixation and loss of reduction.     Procedure:   The patient was identified in the preoperative holding area where the surgical site was marked. The patient was taken to the OR where a procedural timeout was called and the above noted anesthesia was induced.  The patient was positioned supine on a fracture table.  Preoperative antibiotics were dosed.  The patient's left hip was prepped and draped in the usual sterile fashion.  A second preoperative timeout was called.      The patient was placed supine on a fracture table and appropriate reduction was obtained and visualized on fluoroscopy prior to the beginning of the procedure.  The 4 part fracture required significant manipulation to obtain a reasonable reduction.  We made an incision proximal to the greater trochanter and dissected down through the fascia.  We then carefully placed our starting pin with localizing under fluoroscopy checking placement on orthogonal images.  We then used the entry reamer carefully to open the proximal aspect of the femur attempting to avoid displacing the fragments and checking our reaming on orthogonal imaging.  The wire was passed to an appropriate level at the fascial scar of the distal femur and measurement was obtained proximally using fluoroscopy.  We selected a length of nail noted above.  We then proceeded to ream to 1-1/2 mm over the size nail we chose.   At this point we placed our nail localizing under fluoroscopy that it was at the appropriate level prior to using the outrigger device to pass a wire and then the cephalo-medullary screw.  We initially noted  some loss of reduction upon attempting to put the screw in and backed the screw up before full insertion and placed a derotation wire to avoid spinning the head.  The screw was locked proximally to avoid over collapse.  We took final shots at the proximal femur and then  used perfect circle technique to place one distal interlock screw.  Final pictures were obtained.  The wounds were thoroughly irrigated closed in a multilayer fashion.  A sterile dressing was placed.  The patient was awoken from general anesthesia and taken to the PACU in stable condition without complication.     Elmyra Ricks PA-C, present and scrubbed throughout the case, critical for completion in a timely fashion, and for retraction, instrumentation, closure.

## 2017-05-07 DIAGNOSIS — E860 Dehydration: Secondary | ICD-10-CM

## 2017-05-07 DIAGNOSIS — D62 Acute posthemorrhagic anemia: Secondary | ICD-10-CM

## 2017-05-07 DIAGNOSIS — F0390 Unspecified dementia without behavioral disturbance: Secondary | ICD-10-CM

## 2017-05-07 LAB — CBC
HEMATOCRIT: 28.9 % — AB (ref 39.0–52.0)
Hemoglobin: 9.6 g/dL — ABNORMAL LOW (ref 13.0–17.0)
MCH: 28.8 pg (ref 26.0–34.0)
MCHC: 33.2 g/dL (ref 30.0–36.0)
MCV: 86.8 fL (ref 78.0–100.0)
PLATELETS: 261 10*3/uL (ref 150–400)
RBC: 3.33 MIL/uL — AB (ref 4.22–5.81)
RDW: 13.8 % (ref 11.5–15.5)
WBC: 13.3 10*3/uL — ABNORMAL HIGH (ref 4.0–10.5)

## 2017-05-07 LAB — BASIC METABOLIC PANEL
Anion gap: 7 (ref 5–15)
BUN: 14 mg/dL (ref 6–20)
CHLORIDE: 102 mmol/L (ref 101–111)
CO2: 26 mmol/L (ref 22–32)
CREATININE: 0.72 mg/dL (ref 0.61–1.24)
Calcium: 8.5 mg/dL — ABNORMAL LOW (ref 8.9–10.3)
GFR calc non Af Amer: >60 mL/min (ref >60)
Glucose, Bld: 132 mg/dL — ABNORMAL HIGH (ref 65–99)
POTASSIUM: 4 mmol/L (ref 3.5–5.1)
Sodium: 135 mmol/L (ref 135–145)

## 2017-05-07 MED ORDER — BOOST / RESOURCE BREEZE PO LIQD: 1.0000 | Freq: Two times a day (BID) | ORAL | 0 refills | Status: AC

## 2017-05-07 MED ORDER — ENSURE ENLIVE PO LIQD: 237.0000 mL | Freq: Three times a day (TID) | ORAL | 12 refills | Status: AC

## 2017-05-07 NOTE — Progress Notes (Signed)
Discharge to: [facility] Anticipated discharge date: 05/07/17 Family notified: Yes, by phone Transportation by: PTAR  Report #: [phone]  CSW signing off.  Blenda Nicelylizabeth Jakyri Brunkhorst LCSW [phone]

## 2017-05-07 NOTE — NC FL2 (Signed)
[place] MEDICAID FL2 LEVEL OF CARE SCREENING TOOL     IDENTIFICATION  Patient Name: Jonathan Valencia Birthdate: 1927/01/29 Sex: male Admission Date (Current Location): 05/05/2017  [facility] and [place] Number:  [employer] and Address:  [facility], [address] [place], [place] [address]      Provider Number: [id number]  Attending Physician Name and Address:  Calvert Cantor, MD  Relative Name and Phone Number:       Current Level of Care: Hospital Recommended Level of Care: Skilled Nursing Facility Prior Approval Number:    Date Approved/Denied:   PASRR Number: [id number]  Discharge Plan: SNF    Current Diagnoses: Patient Active Problem List   Diagnosis Date Noted  . Closed left hip fracture (HCC) 05/06/2017  . Pressure injury of skin 12/07/2016  . Acute metabolic encephalopathy 12/07/2016  . Unresponsive episode   . Hypotension 12/06/2016  . Closed intertrochanteric fracture, right, initial encounter (HCC)   . Fracture of greater trochanter of right femur (HCC) 08/07/2016  . Dementia 08/07/2016  . Falls frequently 08/07/2016  . Right hip pain 08/07/2016    Orientation RESPIRATION BLADDER Height & Weight     Self, Time, Situation, Place  Normal Incontinent, External catheter (catheter placed 05/07/17) Weight: 99 lb (44.9 kg) (Per daughter) Height:  5\' 5"  (165.1 cm)  BEHAVIORAL SYMPTOMS/MOOD NEUROLOGICAL BOWEL NUTRITION STATUS      Incontinent    AMBULATORY STATUS COMMUNICATION OF NEEDS Skin   Extensive Assist Verbally Surgical wounds (closed incision, 05/06/17; foam dressing, mepilex x 3)                       Personal Care Assistance Level of Assistance  Bathing, Dressing Bathing Assistance: Maximum assistance   Dressing Assistance: Maximum assistance     Functional Limitations Info             SPECIAL CARE FACTORS FREQUENCY  PT (By licensed PT), OT (By licensed OT)     PT Frequency: 5x/wk OT  Frequency: 5x/wk            Contractures      Additional Factors Info  Code Status, Allergies, Psychotropic Code Status Info: full Allergies Info: NKA Psychotropic Info: Depakote sprinkle 125 mg 2x daily         Current Medications (05/07/2017):  This is the current hospital active medication list Current Facility-Administered Medications  Medication Dose Route Frequency Provider Last Rate Last Dose  . ceFAZolin (ANCEF) IVPB 1 g/50 mL premix  1 g Intravenous Q8H Cristie Hem, PA-C   Stopped at 05/07/17 1331  . divalproex (DEPAKOTE SPRINKLE) capsule 125 mg  125 mg Oral BID Meredeth Ide, MD   125 mg at 05/07/17 1000  . donepezil (ARICEPT) tablet 10 mg  10 mg Oral Daily Meredeth Ide, MD   10 mg at 05/07/17 1000  . feeding supplement (ENSURE ENLIVE) (ENSURE ENLIVE) liquid 237 mL  237 mL Oral TID BM Rizwan, Saima, MD   237 mL at 05/07/17 1409  . heparin injection 5,000 Units  5,000 Units Subcutaneous Q8H Meredeth Ide, MD   5,000 Units at 05/07/17 1409  . HYDROcodone-acetaminophen (NORCO/VICODIN) 5-325 MG per tablet 1-2 tablet  1-2 tablet Oral Q6H PRN Meredeth Ide, MD   1 tablet at 05/07/17 1510  . lactated ringers infusion   Intravenous Continuous Heather Roberts, MD 10 mL/hr at 05/07/17 [phone]    . morphine 4 MG/ML injection 0.52 mg  0.52 mg Intravenous Q2H PRN Meredeth IdeLama, Gagan S, MD         Discharge Medications: Please see discharge summary for a list of discharge medications.  Relevant Imaging Results:  Relevant Lab Results:   Additional Information SS#: [id number]  Baldemar LenisElizabeth M Carleena Mires, LCSW

## 2017-05-07 NOTE — Evaluation (Signed)
Physical Therapy Evaluation Patient Details Name: Jonathan Valencia MRN: [id number] DOB: October 14, 1926 Today's Date: 05/07/2017   History of Present Illness  Jonathan HugerLawrence Furches  is a 83 y.o. male with history of dementia, hypertension who was brought to ED from skilled facility after an  unwitnessed fall. X-ray of the hip at the facility showed L hip fracture, now s/p IM Nail on 10/20  Clinical Impression   Patient is s/p above surgery resulting in functional limitations due to the deficits listed below (see PT Problem List). Very agreeable to get up and move; Difficulty with anterior wight shift and rise for transfers;  Patient will benefit from skilled PT to increase their independence and safety with mobility to allow discharge to the venue listed below.       Follow Up Recommendations SNF    Equipment Recommendations  Rolling walker with 5" wheels;3in1 (PT)    Recommendations for Other Services       Precautions / Restrictions Precautions Precautions: Fall Restrictions Weight Bearing Restrictions: Yes LLE Weight Bearing: Weight bearing as tolerated      Mobility  Bed Mobility Overal bed mobility: Needs Assistance Bed Mobility: Supine to Sit     Supine to sit: Mod assist;+2 for physical assistance     General bed mobility comments: Mod assist to move LEs to EOB and elelvate trunk to sit  Transfers Overall transfer level: Needs assistance Equipment used: 2 person hand held assist Transfers: Sit to/from Starwood HotelsStand;Squat Pivot Transfers Sit to Stand: Max assist   Squat pivot transfers: Max assist     General transfer comment: Max assist for any near standing activity like transfers; blocked R knee for stability  Ambulation/Gait             General Gait Details: Unable  Stairs            Wheelchair Mobility    Modified Rankin (Stroke Patients Only)       Balance Overall balance assessment: Needs assistance Sitting-balance support: Single extremity  supported;Bilateral upper extremity supported;Feet supported Sitting balance-Leahy Scale: Fair       Standing balance-Leahy Scale: Zero                               Pertinent Vitals/Pain Pain Assessment: Faces Faces Pain Scale: Hurts little more Pain Location: L hip/thigh Pain Descriptors / Indicators: Aching;Grimacing Pain Intervention(s): Monitored during session    Home Living Family/patient expects to be discharged to:: Skilled nursing facility                      Prior Function Level of Independence: Needs assistance   Gait / Transfers Assistance Needed: per chart review, uses a cane for amb  ADL's / Homemaking Assistance Needed: Assistance for dressing, assistance for bathing - assistance getting into the shower.         Hand Dominance   Dominant Hand: Right    Extremity/Trunk Assessment   Upper Extremity Assessment Upper Extremity Assessment: Generalized weakness    Lower Extremity Assessment Lower Extremity Assessment: Generalized weakness;LLE deficits/detail LLE Deficits / Details: Grossly decr AROM and strength, limited by pain  LLE: Unable to fully assess due to pain       Communication   Communication: Expressive difficulties (Difficulty generating any answers other than yes/no)  Cognition Arousal/Alertness: Awake/alert Behavior During Therapy: WFL for tasks assessed/performed Overall Cognitive Status: No family/caregiver present to determine baseline cognitive functioning  General Comments: Noted history of dementia; difficulty generating any responses to questions other than yes/no      General Comments      Exercises     Assessment/Plan    PT Assessment Patient needs continued PT services  PT Problem List Decreased strength;Decreased range of motion;Decreased activity tolerance;Decreased balance;Decreased mobility;Decreased coordination;Decreased cognition;Decreased  knowledge of use of DME;Decreased safety awareness;Decreased knowledge of precautions       PT Treatment Interventions DME instruction;Gait training;Stair training;Functional mobility training;Therapeutic activities;Therapeutic exercise;Balance training;Patient/family education    PT Goals (Current goals can be found in the Care Plan section)  Acute Rehab PT Goals Patient Stated Goal: did not state, but agreeable to OOB PT Goal Formulation: Patient unable to participate in goal setting Time For Goal Achievement: 05/21/17 Potential to Achieve Goals: Good    Frequency Min 3X/week   Barriers to discharge        Co-evaluation               AM-PAC PT "6 Clicks" Daily Activity  Outcome Measure Difficulty turning over in bed (including adjusting bedclothes, sheets and blankets)?: A Lot Difficulty moving from lying on back to sitting on the side of the bed? : Unable Difficulty sitting down on and standing up from a chair with arms (e.g., wheelchair, bedside commode, etc,.)?: Unable Help needed moving to and from a bed to chair (including a wheelchair)?: Total Help needed walking in hospital room?: Total Help needed climbing 3-5 steps with a railing? : Total 6 Click Score: 7    End of Session Equipment Utilized During Treatment: Gait belt Activity Tolerance: Patient tolerated treatment well Patient left: in chair;with call bell/phone within reach;with chair alarm set Nurse Communication: Mobility status PT Visit Diagnosis: Other abnormalities of gait and mobility (R26.89);Pain Pain - Right/Left: Left Pain - part of body: Hip    Time: 0921-0935 PT Time Calculation (min) (ACUTE ONLY): 14 min   Charges:   PT Evaluation $PT Eval Moderate Complexity: 1 Mod     PT G Codes:        Van Clines, PT  Acute Rehabilitation Services Pager [phone] Office [phone]   Levi Aland 05/07/2017, 12:14 PM

## 2017-05-07 NOTE — Discharge Summary (Signed)
Physician Discharge Summary  Jonathan HugerLawrence Schack ZOX:[id number] DOB: 08-Nov-1926 DOA: 05/05/2017  PCP: System, Provider Not In  Admit date: 05/05/2017 Discharge date: 05/07/2017  Admitted From: SNF Disposition:  SNF   Recommendations for Outpatient Follow-up:  1. F/u Hb  to ensure it is rising 2. outpt Chest CT as discretion of PCP 3. F/u with Dr Everardo PacificVarkey in 10-14 days 4.  Discharge Condition:  stable   CODE STATUS:  Full code   Consultations:  Orthopedic surgery- Dr Everardo PacificVarkey    Discharge Diagnoses:  Principal Problem:   Closed left hip fracture (HCC) Active Problems:   Dementia  Dehydrated  Acute blood loss anemia   Subjective:  mostly non-verbal  Brief Summary: LawrenceSpenceris a 83 y.o.malewith history of dementia, hypertension who was brought to ED from skilled facility after an unwitnessed fall. X-ray of the hip at the facility showed L hip fracture and patient was transferred to ED for further evaluation. X-ray done the ED confirms left intertrochanteric hip fracture. ED physician called and discussed with Dr Everardo PacificVarkey, who recommended patient to [facility] to [facility].  Hospital Course:  Closed left hip fracture  - 10/20 underwent IM nail  - management per ortho- Lovenox and Hydrocodone ordered by ortho    Active Problems: Dehydrated - slow IVF given overnight- can stop today  Anemia - acute blood loss- Hb 13>> 9.6- no need to transfuse yet    Dementia - quite severe- cannot tell me his name or answer questions- he does follow commands - cont Aricept, Depakote  Right hilar fullness - needs CT for further evaluation as outpt   Discharge Instructions  Discharge Instructions    Diet general    Complete by:  As directed    Increase activity slowly    Complete by:  As directed      Allergies as of 05/07/2017   No Known Allergies     Medication List    TAKE these medications   cholecalciferol 1000 units tablet Commonly known as:  VITAMIN  D Take 2,000 Units by mouth daily.   divalproex 125 MG capsule Commonly known as:  DEPAKOTE SPRINKLE Take 125 mg by mouth 2 (two) times daily.   donepezil 10 MG tablet Commonly known as:  ARICEPT Take 10 mg by mouth daily.   enoxaparin 40 MG/0.4ML injection Commonly known as:  LOVENOX Inject 0.4 mLs (40 mg total) into the skin daily.   feeding supplement Liqd Take 1 Container by mouth 2 (two) times daily between meals.   feeding supplement (ENSURE ENLIVE) Liqd Take 237 mLs by mouth 3 (three) times daily between meals.   HYDROcodone-acetaminophen 5-325 MG tablet Commonly known as:  NORCO Take 1-2 tablets by mouth every 6 (six) hours as needed for moderate pain.   multivitamin with minerals Tabs tablet Take 1 tablet by mouth daily.   traZODone 50 MG tablet Commonly known as:  DESYREL Take 50 mg by mouth at bedtime.      Follow-up Information    Bjorn PippinVarkey, Dax T, MD. Schedule an appointment as soon as possible for a visit in 2 week(s).   Specialty:  Orthopedic Surgery Contact information: [address] [place] [address] [phone]          No Known Allergies   Procedures/Studies:  10/20- INTRAMEDULLARY (IM) NAIL INTERTROCHANTRIC  Dg Chest 1 View  Result Date: 05/05/2017 CLINICAL DATA:  Fall with pain on the left side EXAM: CHEST 1 VIEW COMPARISON:  12/06/2016 FINDINGS: Shallow inspiration. Heart size and pulmonary  vascularity are normal. Prominence of right hilum which is most likely due to tortuous vessels but this is more prominent than on previous studies and CT is suggested to exclude hilar lymphadenopathy. Lungs are clear. No blunting of costophrenic angles. No pneumothorax. Tortuous aorta. IMPRESSION: Prominent soft tissue in the right hilum probably relates to tortuous vessels with shallow inspiration but this is more prominent than on previous study and CT is suggested to exclude hilar lymphadenopathy. No evidence of active pulmonary  disease. Electronically Signed   By: Burman Nieves M.D.   On: 05/05/2017 21:39   Dg C-arm 1-60 Min  Result Date: 05/06/2017 CLINICAL DATA:  Left intertrochanteric fracture repair EXAM: DG C-ARM 61-120 MIN; LEFT FEMUR 2 VIEWS COMPARISON:  None FLUOROSCOPY TIME:  2 minutes FINDINGS: Left intertrochanteric fracture transfixed with a intramedullary nail and interlocking femoral neck screw. Mild lateral displacement. No dislocation. Tricompartmental osteoarthritis of the left knee. IMPRESSION: 1. Interval ORIF of a left intertrochanteric fracture. Electronically Signed   By: Elige Ko   On: 05/06/2017 14:18   Dg Hip Unilat W Or Wo Pelvis 2-3 Views Left  Result Date: 05/05/2017 CLINICAL DATA:  Fall with hip pain EXAM: DG HIP (WITH OR WITHOUT PELVIS) 2-3V LEFT COMPARISON:  11/04/2016, 10/03/2016 FINDINGS: Status post right hip replacement with old trochanteric fracture deformity. Pubic symphysis and rami are intact. Acute left intertrochanteric fracture with displaced lesser trochanteric fracture fragment. No dislocation. IMPRESSION: 1. Acute displaced left intertrochanteric fracture 2. Status post intramedullary rodding of the right femur across old right intertrochanteric fracture Electronically Signed   By: Jasmine Pang M.D.   On: 05/05/2017 21:39   Dg Femur Min 2 Views Left  Result Date: 05/06/2017 CLINICAL DATA:  Left intertrochanteric fracture repair EXAM: DG C-ARM 61-120 MIN; LEFT FEMUR 2 VIEWS COMPARISON:  None FLUOROSCOPY TIME:  2 minutes FINDINGS: Left intertrochanteric fracture transfixed with a intramedullary nail and interlocking femoral neck screw. Mild lateral displacement. No dislocation. Tricompartmental osteoarthritis of the left knee. IMPRESSION: 1. Interval ORIF of a left intertrochanteric fracture. Electronically Signed   By: Elige Ko   On: 05/06/2017 14:18       Discharge Exam: Vitals:   05/07/17 0806 05/07/17 1403  BP: 135/79 121/74  Pulse: 91 87  Resp: 17 17   Temp: 98.4 F (36.9 C) 99.5 F (37.5 C)  SpO2: 98% 95%   Vitals:   05/07/17 0200 05/07/17 0545 05/07/17 0806 05/07/17 1403  BP: (!) 129/91 129/75 135/79 121/74  Pulse: 88 85 91 87  Resp: 20 17 17 17   Temp: 99.6 F (37.6 C) 99.1 F (37.3 C) 98.4 F (36.9 C) 99.5 F (37.5 C)  TempSrc: Oral Oral Oral Axillary  SpO2: 99% 100% 98% 95%  Weight:      Height:        General: Pt is alert, awake, not in acute distress Cardiovascular: RRR, S1/S2 +, no rubs, no gallops Respiratory: CTA bilaterally, no wheezing, no rhonchi Abdominal: Soft, NT, ND, bowel sounds + Extremities: no edema, no cyanosis    The results of significant diagnostics from this hospitalization (including imaging, microbiology, ancillary and laboratory) are listed below for reference.     Microbiology: Recent Results (from the past 240 hour(s))  Surgical pcr screen     Status: None   Collection Time: 05/06/17  5:34 AM  Result Value Ref Range Status   MRSA, PCR NEGATIVE NEGATIVE Final   Staphylococcus aureus NEGATIVE NEGATIVE Final    Comment: (NOTE) The Xpert SA Assay (FDA approved  for NASAL specimens in patients 28 years of age and older), is one component of a comprehensive surveillance program. It is not intended to diagnose infection nor to guide or monitor treatment.      Labs: BNP (last 3 results) No results for input(s): BNP in the last 8760 hours. Basic Metabolic Panel:  Recent Labs Lab 05/05/17 2131 05/06/17 0628 05/07/17 0621  NA 139 137 135  K 4.3 3.9 4.0  CL 103 104 102  CO2 26 26 26   GLUCOSE 172* 150* 132*  BUN 21* 18 14  CREATININE 0.79 0.74 0.72  CALCIUM 9.4 9.0 8.5*   Liver Function Tests:  Recent Labs Lab 05/05/17 2131  AST 26  ALT 18  ALKPHOS 72  BILITOT 0.9  PROT 6.6  ALBUMIN 3.4*   No results for input(s): LIPASE, AMYLASE in the last 168 hours. No results for input(s): AMMONIA in the last 168 hours. CBC:  Recent Labs Lab 05/05/17 2131 05/06/17 0628  05/07/17 0621  WBC 16.0* 14.0* 13.3*  NEUTROABS 12.9*  --   --   HGB 13.1 12.3* 9.6*  HCT 39.5 37.3* 28.9*  MCV 87.4 86.3 86.8  PLT 323 309 261   Cardiac Enzymes: No results for input(s): CKTOTAL, CKMB, CKMBINDEX, TROPONINI in the last 168 hours. BNP: Invalid input(s): POCBNP CBG: No results for input(s): GLUCAP in the last 168 hours. D-Dimer No results for input(s): DDIMER in the last 72 hours. Hgb A1c No results for input(s): HGBA1C in the last 72 hours. Lipid Profile No results for input(s): CHOL, HDL, LDLCALC, TRIG, CHOLHDL, LDLDIRECT in the last 72 hours. Thyroid function studies No results for input(s): TSH, T4TOTAL, T3FREE, THYROIDAB in the last 72 hours.  Invalid input(s): FREET3 Anemia work up No results for input(s): VITAMINB12, FOLATE, FERRITIN, TIBC, IRON, RETICCTPCT in the last 72 hours. Urinalysis    Component Value Date/Time   COLORURINE YELLOW 12/06/2016 1010   APPEARANCEUR CLEAR 12/06/2016 1010   LABSPEC 1.016 12/06/2016 1010   PHURINE 6.0 12/06/2016 1010   GLUCOSEU NEGATIVE 12/06/2016 1010   HGBUR NEGATIVE 12/06/2016 1010   BILIRUBINUR NEGATIVE 12/06/2016 1010   KETONESUR NEGATIVE 12/06/2016 1010   PROTEINUR NEGATIVE 12/06/2016 1010   NITRITE NEGATIVE 12/06/2016 1010   LEUKOCYTESUR NEGATIVE 12/06/2016 1010   Sepsis Labs Invalid input(s): PROCALCITONIN,  WBC,  LACTICIDVEN Microbiology Recent Results (from the past 240 hour(s))  Surgical pcr screen     Status: None   Collection Time: 05/06/17  5:34 AM  Result Value Ref Range Status   MRSA, PCR NEGATIVE NEGATIVE Final   Staphylococcus aureus NEGATIVE NEGATIVE Final    Comment: (NOTE) The Xpert SA Assay (FDA approved for NASAL specimens in patients 57 years of age and older), is one component of a comprehensive surveillance program. It is not intended to diagnose infection nor to guide or monitor treatment.      Time coordinating discharge: Over 30 minutes  SIGNED:   Calvert Cantor,  MD  [facility] 05/07/2017, 3:34 PM Pager   If 7PM-7AM, please contact night-coverage [email or web address] Password [email or web address]

## 2017-05-07 NOTE — Progress Notes (Signed)
ORTHOPAEDIC PROGRESS NOTE  s/p Procedure(s): INTRAMEDULLARY (IM) NAIL INTERTROCHANTRIC  SUBJECTIVE: NAD  OBJECTIVE: PE: LLE: dressing cdi, wiggles toes, difficult motor and sensory exam in setting of dementia. Compartments soft and compressible, with no pain on passive stretch.   Vitals:   05/07/17 0200 05/07/17 0545  BP: (!) 129/91 129/75  Pulse: 88 85  Resp: 20 17  Temp: 99.6 F (37.6 C) 99.1 F (37.3 C)  SpO2: 99% 100%     ASSESSMENT: Jonathan Valencia is a 83 y.o. male doing well postoperatively.  PLAN: -WBAT -PT/OT -Keep dssg intact -Pain control: favor PRN's, gradual transition to PO's -Continue postoperative Abx x 24h -DVT Ppx: favor mechanical & daily ASA -Discharge plan: pending PT/OT clearance & pain control -Follow up plan: 10-14 days post-op

## 2017-05-07 NOTE — Progress Notes (Signed)
[facility] to [facility]

## 2017-05-07 NOTE — Clinical Social Work Note (Signed)
Clinical Social Work Assessment  Patient Details  Name: Jonathan HugerLawrence Schnider MRN: [id number] Date of Birth: 25-Oct-1926  Date of referral:  05/07/17               Reason for consult:  Facility Placement                Permission sought to share information with:  Family Supports, Oceanographeracility Contact Representative Permission granted to share information::  Yes, Verbal Permission Granted  Name::     IT consultantLisa  Agency::  [facility]  Relationship::  Daughter  SolicitorContact Information:     Housing/Transportation Living arrangements for the past 2 months:  Skilled Building surveyorursing Facility Source of Information:  Adult Children Patient Interpreter Needed:  None Criminal Activity/Legal Involvement Pertinent to Current Situation/Hospitalization:  No - Comment as needed Significant Relationships:  Adult Children Lives with:  Self, Facility Resident Do you feel safe going back to the place where you live?  Yes Need for family participation in patient care:  Yes (Comment) (patient has dementia)  Care giving concerns:  [facility] [place] and family has no concerns about care.   Social Worker assessment / plan:  CSW alerted by RN that MD had discharged patient. CSW confirmed with patient's daughter that patient would return to SNF. CSW completed paperwork and sent to facility. CSW to set up transport.  Employment status:  Retired Health and safety inspectornsurance information:  Medicare PT Recommendations:  Skilled Nursing Facility Information / Referral to community resources:     Patient/Family's Response to care:  Patient's daughter agreeable to return to SNF.  Patient/Family's Understanding of and Emotional Response to Diagnosis, Current Treatment, and Prognosis:  Patient's daughter is hopeful that the patient can have his [place]ed [place], as the facility will not provide transport [place] the [place] back [place]. Patient's daughter is appreciative of CSW assistance in coordinating  return to SNF.  Emotional Assessment Appearance:  Appears stated age Attitude/Demeanor/Rapport:    Affect (typically observed):    Orientation:  Oriented to Self Alcohol / Substance use:  Not Applicable Psych involvement (Current and /or in the community):  No (Comment)  Discharge Needs  Concerns to be addressed:  Care Coordination Readmission within the last 30 days:  No Current discharge risk:  Physical Impairment, Cognitively Impaired Barriers to Discharge:  No Barriers Identified   Baldemar Lenislizabeth M Lenette Rau, LCSW 05/07/2017, 4:32 PM

## 2017-05-07 NOTE — Progress Notes (Signed)
Patient discharged to SNF via stretcher. Daughter at bedside.

## 2017-05-08 ENCOUNTER — Telehealth: Payer: Self-pay | Admitting: Orthopedic Surgery

## 2017-05-08 ENCOUNTER — Encounter (HOSPITAL_COMMUNITY): Payer: Self-pay | Admitting: Orthopaedic Surgery

## 2017-05-08 NOTE — Telephone Encounter (Signed)
Relayed to patient/daughter, designated party contact.

## 2017-05-08 NOTE — Telephone Encounter (Signed)
Patient's daughter and designated contact, Ronnald CollumLisa Ore, called to inquire about whether Dr Romeo AppleHarrison would assume post-operative care for left hip surgery (IM Nail) for which [facility] had [facility]. States her dad resides at [facility] ([facility] [place] [employer] [place]  Aware that a request for transfer of care would be needed from the current orthopaedic surgeon.

## 2017-05-08 NOTE — Telephone Encounter (Signed)
Need more info   DOS   Dr with phone number   Post op plan

## 2017-05-16 NOTE — Telephone Encounter (Signed)
Appointment pending as discussed.

## 2017-10-17 ENCOUNTER — Inpatient Hospital Stay (HOSPITAL_COMMUNITY)
Admission: EM | Admit: 2017-10-17 | Discharge: 2017-11-15 | DRG: 871 | Disposition: E | Payer: Medicare Other | Attending: Internal Medicine | Admitting: Internal Medicine

## 2017-10-17 ENCOUNTER — Emergency Department (HOSPITAL_COMMUNITY): Payer: Medicare Other

## 2017-10-17 ENCOUNTER — Inpatient Hospital Stay (HOSPITAL_COMMUNITY): Payer: Medicare Other

## 2017-10-17 DIAGNOSIS — R402112 Coma scale, eyes open, never, at arrival to emergency department: Secondary | ICD-10-CM | POA: Diagnosis present

## 2017-10-17 DIAGNOSIS — E43 Unspecified severe protein-calorie malnutrition: Secondary | ICD-10-CM | POA: Diagnosis present

## 2017-10-17 DIAGNOSIS — Z79899 Other long term (current) drug therapy: Secondary | ICD-10-CM

## 2017-10-17 DIAGNOSIS — R402212 Coma scale, best verbal response, none, at arrival to emergency department: Secondary | ICD-10-CM | POA: Diagnosis present

## 2017-10-17 DIAGNOSIS — Z7901 Long term (current) use of anticoagulants: Secondary | ICD-10-CM

## 2017-10-17 DIAGNOSIS — E872 Acidosis: Secondary | ICD-10-CM | POA: Diagnosis present

## 2017-10-17 DIAGNOSIS — Z515 Encounter for palliative care: Secondary | ICD-10-CM | POA: Diagnosis not present

## 2017-10-17 DIAGNOSIS — Z681 Body mass index (BMI) 19 or less, adult: Secondary | ICD-10-CM

## 2017-10-17 DIAGNOSIS — E871 Hypo-osmolality and hyponatremia: Secondary | ICD-10-CM | POA: Diagnosis not present

## 2017-10-17 DIAGNOSIS — Z66 Do not resuscitate: Secondary | ICD-10-CM | POA: Diagnosis present

## 2017-10-17 DIAGNOSIS — J9601 Acute respiratory failure with hypoxia: Secondary | ICD-10-CM | POA: Diagnosis present

## 2017-10-17 DIAGNOSIS — E860 Dehydration: Secondary | ICD-10-CM | POA: Diagnosis present

## 2017-10-17 DIAGNOSIS — I10 Essential (primary) hypertension: Secondary | ICD-10-CM | POA: Diagnosis present

## 2017-10-17 DIAGNOSIS — N179 Acute kidney failure, unspecified: Secondary | ICD-10-CM | POA: Diagnosis present

## 2017-10-17 DIAGNOSIS — Z993 Dependence on wheelchair: Secondary | ICD-10-CM

## 2017-10-17 DIAGNOSIS — F0390 Unspecified dementia without behavioral disturbance: Secondary | ICD-10-CM | POA: Diagnosis present

## 2017-10-17 DIAGNOSIS — E870 Hyperosmolality and hypernatremia: Secondary | ICD-10-CM | POA: Diagnosis present

## 2017-10-17 DIAGNOSIS — R6521 Severe sepsis with septic shock: Secondary | ICD-10-CM | POA: Diagnosis present

## 2017-10-17 DIAGNOSIS — A419 Sepsis, unspecified organism: Principal | ICD-10-CM | POA: Diagnosis present

## 2017-10-17 DIAGNOSIS — R402312 Coma scale, best motor response, none, at arrival to emergency department: Secondary | ICD-10-CM | POA: Diagnosis present

## 2017-10-17 DIAGNOSIS — E869 Volume depletion, unspecified: Secondary | ICD-10-CM | POA: Diagnosis present

## 2017-10-17 DIAGNOSIS — Z4659 Encounter for fitting and adjustment of other gastrointestinal appliance and device: Secondary | ICD-10-CM

## 2017-10-17 DIAGNOSIS — J690 Pneumonitis due to inhalation of food and vomit: Secondary | ICD-10-CM | POA: Diagnosis present

## 2017-10-17 LAB — APTT: APTT: 29 s (ref 24–36)

## 2017-10-17 LAB — PROTIME-INR
INR: 2.11
INR: 1.91
Prothrombin Time: 23.5 seconds — ABNORMAL HIGH (ref 11.4–15.2)
Prothrombin Time: 21.7 seconds — ABNORMAL HIGH (ref 11.4–15.2)

## 2017-10-17 LAB — COMPREHENSIVE METABOLIC PANEL
ALBUMIN: 2.3 g/dL — AB (ref 3.5–5.0)
ALT: 108 U/L — AB (ref 17–63)
AST: 176 U/L — AB (ref 15–41)
Alkaline Phosphatase: 73 U/L (ref 38–126)
Anion gap: 30 — ABNORMAL HIGH (ref 5–15)
BUN: 64 mg/dL — AB (ref 6–20)
CO2: 18 mmol/L — AB (ref 22–32)
CREATININE: 1.98 mg/dL — AB (ref 0.61–1.24)
Calcium: 9.1 mg/dL (ref 8.9–10.3)
Chloride: 110 mmol/L (ref 101–111)
GFR calc Af Amer: 33 mL/min — ABNORMAL LOW (ref >60)
GFR calc non Af Amer: 28 mL/min — ABNORMAL LOW (ref >60)
Glucose, Bld: 85 mg/dL (ref 65–99)
Potassium: 4.4 mmol/L (ref 3.5–5.1)
SODIUM: 158 mmol/L — AB (ref 135–145)
Total Bilirubin: 2.9 mg/dL — ABNORMAL HIGH (ref 0.3–1.2)
Total Protein: 7.2 g/dL (ref 6.5–8.1)

## 2017-10-17 LAB — CBC WITH DIFFERENTIAL/PLATELET
Basophils Absolute: 0 10*3/uL (ref 0.0–0.1)
Basophils Relative: 0 %
EOS ABS: 0 10*3/uL (ref 0.0–0.7)
Eosinophils Relative: 0 %
HCT: 49.8 % (ref 39.0–52.0)
Hemoglobin: 15.2 g/dL (ref 13.0–17.0)
LYMPHS ABS: 1.8 10*3/uL (ref 0.7–4.0)
LYMPHS PCT: 12 %
MCH: 28.3 pg (ref 26.0–34.0)
MCHC: 30.5 g/dL (ref 30.0–36.0)
MCV: 92.6 fL (ref 78.0–100.0)
MONOS PCT: 4 %
Monocytes Absolute: 0.6 10*3/uL (ref 0.1–1.0)
NEUTROS ABS: 12.3 10*3/uL — AB (ref 1.7–7.7)
Neutrophils Relative %: 84 %
Platelets: 411 10*3/uL — ABNORMAL HIGH (ref 150–400)
RBC: 5.38 MIL/uL (ref 4.22–5.81)
RDW: 16.6 % — ABNORMAL HIGH (ref 11.5–15.5)
WBC: 14.7 10*3/uL — ABNORMAL HIGH (ref 4.0–10.5)

## 2017-10-17 LAB — TROPONIN I: Troponin I: 0.08 ng/mL (ref <0.03)

## 2017-10-17 LAB — I-STAT CHEM 8, ED
BUN: 60 mg/dL — AB (ref 6–20)
CALCIUM ION: 1.12 mmol/L — AB (ref 1.15–1.40)
CHLORIDE: 118 mmol/L — AB (ref 101–111)
CREATININE: 2.1 mg/dL — AB (ref 0.61–1.24)
Glucose, Bld: 91 mg/dL (ref 65–99)
HCT: 48 % (ref 39.0–52.0)
Hemoglobin: 16.3 g/dL (ref 13.0–17.0)
Potassium: 4.5 mmol/L (ref 3.5–5.1)
Sodium: 157 mmol/L — ABNORMAL HIGH (ref 135–145)
TCO2: 22 mmol/L (ref 22–32)

## 2017-10-17 LAB — BLOOD GAS, ARTERIAL
Acid-base deficit: 9.8 mmol/L — ABNORMAL HIGH (ref 0.0–2.0)
BICARBONATE: 16.1 mmol/L — AB (ref 20.0–28.0)
DRAWN BY: 234301
FIO2: 100
MECHVT: 500 mL
O2 Saturation: 98.8 %
PATIENT TEMPERATURE: 37
PO2 ART: 230 mmHg — AB (ref 83.0–108.0)
RATE: 14 resp/min
pCO2 arterial: 45.5 mmHg (ref 32.0–48.0)
pH, Arterial: 7.193 — CL (ref 7.350–7.450)

## 2017-10-17 LAB — URINALYSIS, ROUTINE W REFLEX MICROSCOPIC
Bilirubin Urine: NEGATIVE
Glucose, UA: NEGATIVE mg/dL
HGB URINE DIPSTICK: NEGATIVE
Ketones, ur: NEGATIVE mg/dL
LEUKOCYTES UA: NEGATIVE
NITRITE: NEGATIVE
PH: 5 (ref 5.0–8.0)
Protein, ur: 100 mg/dL — AB
SPECIFIC GRAVITY, URINE: 1.016 (ref 1.005–1.030)

## 2017-10-17 LAB — I-STAT CG4 LACTIC ACID, ED: Lactic Acid, Venous: 10.46 mmol/L (ref 0.5–1.9)

## 2017-10-17 LAB — PROCALCITONIN: Procalcitonin: 17.36 ng/mL

## 2017-10-17 LAB — I-STAT TROPONIN, ED: Troponin i, poc: 0.04 ng/mL (ref 0.00–0.08)

## 2017-10-17 LAB — MRSA PCR SCREENING: MRSA by PCR: NEGATIVE

## 2017-10-17 LAB — LACTIC ACID, PLASMA: Lactic Acid, Venous: 5.7 mmol/L (ref 0.5–1.9)

## 2017-10-17 LAB — VALPROIC ACID LEVEL: Valproic Acid Lvl: <10 ug/mL — ABNORMAL LOW (ref 50.0–100.0)

## 2017-10-17 MED ORDER — ACETAMINOPHEN 325 MG PO TABS: 650.0000 mg | ORAL_TABLET | Freq: Four times a day (QID) | ORAL | Status: DC | PRN

## 2017-10-17 MED ORDER — EPINEPHRINE PF 1 MG/ML IJ SOLN
0.5000 mg | Freq: Once | INTRAMUSCULAR | Status: AC
  Administered 2017-10-17: 0.5 mg via INTRAVENOUS

## 2017-10-17 MED ORDER — FENTANYL BOLUS VIA INFUSION
25.0000 ug | INTRAVENOUS | Status: DC | PRN
  Filled 2017-10-17: qty 25

## 2017-10-17 MED ORDER — VANCOMYCIN HCL IN DEXTROSE 1-5 GM/200ML-% IV SOLN: 1000.0000 mg | INTRAVENOUS | Status: DC

## 2017-10-17 MED ORDER — VANCOMYCIN HCL IN DEXTROSE 1-5 GM/200ML-% IV SOLN
1000.0000 mg | Freq: Once | INTRAVENOUS | Status: AC
  Administered 2017-10-17: 1000 mg via INTRAVENOUS
  Filled 2017-10-17: qty 200

## 2017-10-17 MED ORDER — DEXTROSE-NACL 5-0.45 % IV SOLN
INTRAVENOUS | Status: DC
  Administered 2017-10-17: 12:00:00 via INTRAVENOUS

## 2017-10-17 MED ORDER — SODIUM CHLORIDE 0.9 % IV BOLUS
1000.0000 mL | Freq: Once | INTRAVENOUS | Status: AC
  Administered 2017-10-17: 1000 mL via INTRAVENOUS

## 2017-10-17 MED ORDER — ACETAMINOPHEN 650 MG RE SUPP: 650.0000 mg | Freq: Four times a day (QID) | RECTAL | Status: DC | PRN

## 2017-10-17 MED ORDER — MIDAZOLAM HCL 2 MG/2ML IJ SOLN: 1.0000 mg | INTRAMUSCULAR | Status: DC | PRN

## 2017-10-17 MED ORDER — FENTANYL CITRATE (PF) 100 MCG/2ML IJ SOLN
50.0000 ug | Freq: Once | INTRAMUSCULAR | Status: AC
  Administered 2017-10-17: 50 ug via INTRAVENOUS
  Filled 2017-10-17: qty 2

## 2017-10-17 MED ORDER — SODIUM CHLORIDE 0.9 % IV BOLUS
1000.0000 mL | Freq: Once | INTRAVENOUS | Status: AC
Start: 2017-10-17 — End: 2017-10-17
  Administered 2017-10-17: 1000 mL via INTRAVENOUS

## 2017-10-17 MED ORDER — MORPHINE SULFATE (PF) 2 MG/ML IV SOLN
2.0000 mg | INTRAVENOUS | Status: DC | PRN
  Administered 2017-10-17 – 2017-10-18 (×3): 2 mg via INTRAVENOUS
  Filled 2017-10-17 (×4): qty 1

## 2017-10-17 MED ORDER — PIPERACILLIN-TAZOBACTAM 3.375 G IVPB 30 MIN: 3.3750 g | Freq: Three times a day (TID) | INTRAVENOUS | Status: DC

## 2017-10-17 MED ORDER — PIPERACILLIN-TAZOBACTAM 3.375 G IVPB 30 MIN
3.3750 g | Freq: Once | INTRAVENOUS | Status: AC
  Administered 2017-10-17: 3.375 g via INTRAVENOUS
  Filled 2017-10-17: qty 50

## 2017-10-17 MED ORDER — SODIUM CHLORIDE 0.9 % IV BOLUS (SEPSIS)
500.0000 mL | Freq: Once | INTRAVENOUS | Status: AC
  Administered 2017-10-17: 500 mL via INTRAVENOUS

## 2017-10-17 MED ORDER — ONDANSETRON HCL 4 MG/2ML IJ SOLN
4.0000 mg | Freq: Four times a day (QID) | INTRAMUSCULAR | Status: DC | PRN
Start: 2017-10-17 — End: 2017-10-18

## 2017-10-17 MED ORDER — SODIUM CHLORIDE 0.9 % IV SOLN
25.0000 ug/h | INTRAVENOUS | Status: DC
  Administered 2017-10-17 (×2): 50 ug/h via INTRAVENOUS
  Filled 2017-10-17: qty 50

## 2017-10-17 MED ORDER — ONDANSETRON HCL 4 MG PO TABS: 4.0000 mg | ORAL_TABLET | Freq: Four times a day (QID) | ORAL | Status: DC | PRN

## 2017-10-17 MED ORDER — ETOMIDATE 2 MG/ML IV SOLN
20.0000 mg | Freq: Once | INTRAVENOUS | Status: AC
  Administered 2017-10-17: 20 mg via INTRAVENOUS

## 2017-10-17 MED ORDER — DEXTROSE 5 % IV SOLN
0.0000 ug/min | INTRAVENOUS | Status: DC
  Administered 2017-10-17: 5 ug/min via INTRAVENOUS
  Filled 2017-10-17: qty 4

## 2017-10-17 MED ORDER — ROCURONIUM BROMIDE 50 MG/5ML IV SOLN
100.0000 mg | Freq: Once | INTRAVENOUS | Status: AC
  Administered 2017-10-17: 100 mg via INTRAVENOUS
  Filled 2017-10-17: qty 10

## 2017-10-17 MED ORDER — PIPERACILLIN-TAZOBACTAM 3.375 G IVPB: 3.3750 g | Freq: Three times a day (TID) | INTRAVENOUS | Status: DC

## 2017-10-17 MED ORDER — HEPARIN SODIUM (PORCINE) 5000 UNIT/ML IJ SOLN: 5000.0000 [IU] | Freq: Three times a day (TID) | INTRAMUSCULAR | Status: DC

## 2017-10-17 MED ORDER — SODIUM CHLORIDE 0.9 % IV BOLUS (SEPSIS)
1000.0000 mL | Freq: Once | INTRAVENOUS | Status: AC
  Administered 2017-10-17: 1000 mL via INTRAVENOUS

## 2017-10-17 NOTE — ED Notes (Signed)
Dr Juanetta GoslingHawkins in to see pt and speak wit family.

## 2017-10-17 NOTE — ED Notes (Signed)
Per Neldon Mcina Talbott, RN.  #7.5 (22 @ the lips) ETT placed via Dr Manus Gunningancour.  Vent settings TV 500, rate 14, and FIO2 100%.

## 2017-10-17 NOTE — ED Triage Notes (Addendum)
Pt brought in by [facility] for c/o respiratory distress; pt was found by staff at 0430 with O2 sats in the 30's and pt having agonal breathing; staff reported to ems pt has been "actively dying" for the last 2 days; pt is cyanotic and unresponsive

## 2017-10-17 NOTE — ED Notes (Signed)
Dr Juanetta GoslingHawkins in, RT at bedside.  ETT pulled back to 20 cm at the lips.

## 2017-10-17 NOTE — H&P (Signed)
History and Physical  Jonathan Valencia VHQ:[id number] DOB: [birth year] DOA: Nov 06, 2017   PCP: System, Provider Not In   Patient coming from: Home  Chief Complaint: respiratory distress  HPI:  Jonathan Valencia is a 84 y.o. male with medical history of dementia, hypertension presenting with altered mental status and respiratory distress.  The patient is unable to provide any history secondary to his extremis.  The patient's daughters at the bedside to supplement the history.  She states that approximately 10 days prior to admission, she noted the patient to have decreased oral intake, lethargy, and shortness of breath.  The patient's symptoms continue to progress over the next week with increasing lethargy and little to no oral intake.  Apparently blood work has been obtained at his nursing facility and a chest x-ray was also obtained.  Per the patient's daughter, there was no pneumonia and the patient had a slightly elevated WBC.  It was initially felt that the patient had a progression of his underlying dementia.  At baseline, the patient does not walk.  He is wheelchair-bound.  He is able to say a few words but unable to speak in complete sentences at baseline.  He normally able to feed himself.  Nevertheless, the patient daughter visited the patient on the evening of 10/16/2017 and noted the patient to have increasing lethargy and shortness of breath.  As result, the patient was transferred to the hospital for further evaluation.  The patient was noted to be in extremis with respiratory distress with oxygen saturation in the 30s and tachycardia.  He was intubated in the emergency department and placed on mechanical ventilation.  The patient became hypotensive and was started on levo fed at 5 mg/kg/min.  Initial chest x-ray showed bibasilar opacities, likely atelectasis.  WBC was 14.7 with lactic acid 10.46.  Serum creatinine was 1.98 with serum sodium 158.  Patient was given 2.5 L of normal saline and  started on vancomycin and Zosyn.  Urinalysis was negative for pyuria.    Assessment/Plan: Sepsis -Secondary to aspiration pneumonia/HCAP -The patient will be continued on vancomycin and Zosyn for now -Lactic acid peaked at 10.46 -Check procalcitonin -Continue IV fluids and Levophed for now -Blood and urine cultures have been obtained -I had a long discussion with the patient's daughter at the bedside.  She stated that she only wanted the patient to be intubated so that she was able to come to his bedside to pay her last respects.  She stated she did not want her father to suffer any longer.  She stated that she realizes that his baseline quality of life is poor and understands that should he even survive this hospitalization, his quality of life would be diminished to further extent.  Given the patient's advanced age, advanced dementia, and poor underlying baseline function, she felt it was in his best interest to convert his focus of care directed at full comfort.  The patient will continue on his current antibiotics and vasopressors for the time being until all the family members are able to visit.  After this period of time, the patient's daughter wanted to pursue compassionate extubation in concordance with focusing on his comfort and dignity.  Acute respiratory failure with hypoxia -Secondary to aspiration pneumonia -The patient was intubated and mechanically ventilated in the emergency department -As discussed above, ultimately plan for compassionate extubation  Aspiration pneumonia -Food and particulate matter was noted in the patient's airway during intubation -Continue Zosyn for now as discussed until  which time the patient is transition to full comfort  Acute kidney injury -Secondary to volume depletion and sepsis -Continue IV fluids for now as discussed until which time the patient is transition to full comfort -Baseline creatinine 0.7-0.8  Advanced dementia -Poor baseline  functioning -At baseline wheelchair bound and only able to speak a few words  Essential hypertension -The patient was not on any antihypertensive medications -He is currently hypotensive  Hypernatremia -Secondary to volume depletion -Continue D5 half-normal saline until which time the patient is transition to full comfort   The patient is critically ill with multiple organ systems failure and requires high complexity decision making for assessment and support, frequent evaluation and titration of therapies, application of advanced monitoring technologies and extensive interpretation of multiple databases.  Critical care time - 60 mins.      Past Medical History:  Diagnosis Date  . Dementia   . Hypertension    Past Surgical History:  Procedure Laterality Date  . HERNIA REPAIR    . INTRAMEDULLARY (IM) NAIL INTERTROCHANTERIC Right 08/09/2016   Procedure: INTERNAL FIXATION RIGHT HIP;  Surgeon: Vickki Hearing, MD;  Location: [facility];  Service: Orthopedics;  Laterality: Right;  . INTRAMEDULLARY (IM) NAIL INTERTROCHANTERIC Left 05/06/2017   Procedure: INTRAMEDULLARY (IM) NAIL INTERTROCHANTRIC;  Surgeon: Bjorn Pippin, MD;  Location: [facility];  Service: Orthopedics;  Laterality: Left;   Social History:  reports that he has never smoked. He has never used smokeless tobacco. He reports that he does not drink alcohol or use drugs.   Family history: Unobtainable secondary the patient's extremis and advanced dementia  No Known Allergies   Prior to Admission medications   Medication Sig Start Date End Date Taking? Authorizing Provider  cholecalciferol (VITAMIN D) 1000 units tablet Take 2,000 Units by mouth daily.     [provider]  divalproex (DEPAKOTE SPRINKLE) 125 MG capsule Take 125 mg by mouth 2 (two) times daily.     [provider]  donepezil (ARICEPT) 10 MG tablet Take 10 mg by mouth daily.    [provider]  enoxaparin (LOVENOX) 40 MG/0.4ML injection  Inject 0.4 mLs (40 mg total) into the skin daily. 05/06/17   Cristie Hem, PA-C  feeding supplement (BOOST / RESOURCE BREEZE) LIQD Take 1 Container by mouth 2 (two) times daily between meals. 05/07/17   Calvert Cantor, MD  feeding supplement, ENSURE ENLIVE, (ENSURE ENLIVE) LIQD Take 237 mLs by mouth 3 (three) times daily between meals. 05/07/17   Calvert Cantor, MD  HYDROcodone-acetaminophen (NORCO) 5-325 MG tablet Take 1-2 tablets by mouth every 6 (six) hours as needed for moderate pain. 05/06/17   Cristie Hem, PA-C  Multiple Vitamin (MULTIVITAMIN WITH MINERALS) TABS tablet Take 1 tablet by mouth daily.    [provider]  traZODone (DESYREL) 50 MG tablet Take 50 mg by mouth at bedtime.     [provider]    Review of Systems:  Unobtainable secondary to patient's dementia and extremis  Physical Exam: Vitals:   19-Oct-2017 0745 2017/10/19 0800 10-19-17 0815 19-Oct-2017 0830  BP:  (!) 89/67 95/69   Pulse: (!) 114 (!) 111 (!) 108 (!) 110  Resp: 14 14 14 14   Temp: 98.4 F (36.9 C) 98.4 F (36.9 C) 98.2 F (36.8 C) 98.1 F (36.7 C)  SpO2: 100% 100% 100% 99%   General: Currently sedated on mechanical ventilation.  He is emaciated Head/Eye: No conjunctival hemorrhage, no icterus, [place]/AT, No nystagmus ENT:  No icterus,  no pharyngeal exudate Neck:  No masses, no lymphadenpathy, no bruits CV:  RRR, no rub, no gallop, no S3 Lung: Bilateral scattered rhonchi.  There is no wheezing. Abdomen: soft/NT, +BS, nondistended, no peritoneal signs Ext:  No rashes, No petechiae, No lymphangitis, No edema; small 3 mm wound on the medial ankle of the left foot with small amount of yellow drainage.   Labs on Admission:  Basic Metabolic Panel: Recent Labs  Lab 11/13/2017 0626 10/29/2017 0628  NA 158* 157*  K 4.4 4.5  CL 110 118*  CO2 18*  --   GLUCOSE 85 91  BUN 64* 60*  CREATININE 1.98* 2.10*  CALCIUM 9.1  --    Liver Function Tests: Recent Labs  Lab 11/07/2017 0626  AST 176*   ALT 108*  ALKPHOS 73  BILITOT 2.9*  PROT 7.2  ALBUMIN 2.3*   No results for input(s): LIPASE, AMYLASE in the last 168 hours. No results for input(s): AMMONIA in the last 168 hours. CBC: Recent Labs  Lab 10/20/2017 0626 10/23/2017 0628  WBC 14.7*  --   NEUTROABS 12.3*  --   HGB 15.2 16.3  HCT 49.8 48.0  MCV 92.6  --   PLT 411*  --    Coagulation Profile: Recent Labs  Lab 10/24/2017 0626  INR 2.11   Cardiac Enzymes: Recent Labs  Lab 11/03/2017 0626  TROPONINI 0.08*   BNP: Invalid input(s): POCBNP CBG: No results for input(s): GLUCAP in the last 168 hours. Urine analysis:    Component Value Date/Time   COLORURINE AMBER (A) 11/01/2017 0718   APPEARANCEUR CLOUDY (A) 11/11/2017 0718   LABSPEC 1.016 10/24/2017 0718   PHURINE 5.0 11/06/2017 0718   GLUCOSEU NEGATIVE 10/21/2017 0718   HGBUR NEGATIVE 11/09/2017 0718   BILIRUBINUR NEGATIVE 11/04/2017 0718   KETONESUR NEGATIVE 10/16/2017 0718   PROTEINUR 100 (A) 11/02/2017 0718   NITRITE NEGATIVE 11/01/2017 0718   LEUKOCYTESUR NEGATIVE 11/12/2017 0718   Sepsis Labs: @LABRCNTIP (procalcitonin:4,lacticidven:4) )No results found for this or any previous visit (from the past 240 hour(s)).   Radiological Exams on Admission: Dg Chest Portable 1 View  Result Date: 10/21/2017 CLINICAL DATA:  Status post endotracheal intubation and central line placement. EXAM: PORTABLE CHEST 1 VIEW COMPARISON:  Chest x-ray of May 05, 2017 FINDINGS: And endotracheal tube is present whose tip projects approximately 1.9 cm above the carina. The lungs are borderline hypoinflated. There are bibasilar interstitial densities. The heart and pulmonary vascularity are normal. There is tortuosity of the ascending and descending thoracic aorta. There is a linear structure extending inferiorly from the base of the neck into the right mid lung which could be a vascular catheter. There is a left subclavian Cordis sheath in place whose tip terminates at the junction  of the right and left brachiocephalic veins. IMPRESSION: Reasonable positioning of the Cordis sheath and endotracheal tube. Unspecified tubular structure projects along the course of the right mainstem bronchus and comes to overlie the right mid lung. According to Dr. Manus Gunningancour on a subsequent x-ray (that he is able to view) there is an esophagogastric tube appropriately positioned within the stomach. The nature of this tubular-linear structure that courses along the trachea and right mainstem bronchus is not clear. Bibasilar atelectasis. No overt CHF nor alveolar pneumonia. No pleural effusion or pneumothorax. These results were called by telephone at the time of interpretation on 10/25/2017 at 7:06 am to Dr. Glynn OctaveSTEPHEN RANCOUR , who verbally acknowledged these results. Electronically Signed   By: Laronda Lisby  [place] M.D.  On: 11/09/2017 07:14   Dg Chest Port 1v Same Day  Result Date: 11/09/2017 CLINICAL DATA:  Nasogastric placement. EXAM: PORTABLE CHEST 1 VIEW COMPARISON:  Earlier same day. FINDINGS: Nasogastric tube enters the stomach with its tip in the fundus. Gas pattern is unremarkable. Endotracheal tube appears to be at the carina, directed towards the right mainstem bronchus. IMPRESSION: Nasogastric tube tip in the gastric fundus. Endotracheal tube at the carina. Electronically Signed   By: Paulina Fusi M.D.   On: 11/10/2017 07:28    EKG: Independently reviewed. Sinus, nonspecific T wave change    Time spent:60 minutes Code Status:   DNR Family Communication:  Daughter updated at bedside Disposition Plan: expect in-hospital death Consults called: none  DVT Prophylaxis: [place] Heparin    Catarina Hartshorn, DO  [facility] Pager [phone]  If 7PM-7AM, please contact night-coverage [email or web address] Password [facility] 10/17/2017, 8:32 AM

## 2017-10-17 NOTE — Progress Notes (Signed)
Pharmacy Antibiotic Note  Jonathan Valencia is Valencia 84 y.o. male admitted on 11/05/2017 with sepsis.  Pharmacy has been consulted for Vancomycin and Zosyn dosing.  Plan:  Vancomycin 1000mg  IV q24h Check trough at steady state Zosyn 3.375gm IV q8h, EID Monitor labs, renal fxn, progress and c/s De[place]calate ABX when improved / appropriate.       Temp (24hrs), Avg:99.1 F (37.3 C), Min:97.9 F (36.6 C), Max:100.6 F (38.1 C)  Recent Labs  Lab 2017/10/12 0626 2017/10/12 0627 2017/10/12 0628 2017/10/12 1015  WBC 14.7*  --   --   --   CREATININE 1.98*  --  2.10*  --   LATICACIDVEN  --  10.46*  --  5.7*    CrCl cannot be calculated (Unknown ideal weight.).    No Known Allergies  Antimicrobials this admission: Vancomycin 4/2 >>  Zosyn 4/2 >>   Recent Results (from the past 240 hour(s))  Blood culture (routine x 2)     Status: None (Preliminary result)   Collection Time: 2017/10/12  7:01 AM  Result Value Ref Range Status   Specimen Description BLOOD LEFT ARM  Final   Special Requests   Final    BOTTLES DRAWN AEROBIC AND ANAEROBIC Blood Culture adequate volume Performed at [facility], [address] [place], [place] [address]    Culture PENDING  Incomplete   Report Status PENDING  Incomplete  Blood culture (routine x 2)     Status: None (Preliminary result)   Collection Time: 2017/10/12  7:16 AM  Result Value Ref Range Status   Specimen Description BLOOD LEFT HAND  Final   Special Requests   Final    BOTTLES DRAWN AEROBIC ONLY Blood Culture results may not be optimal due to an inadequate volume of blood received in culture bottles Performed at [facility], [address] [place], [place] [address]    Culture PENDING  Incomplete   Report Status PENDING  Incomplete   Thank you for allowing pharmacy to be Valencia part of this patient's care.  Jonathan HartHall, Jonathan Valencia 10/19/2017 1:26 PM

## 2017-10-17 NOTE — ED Provider Notes (Signed)
[facility] EMERGENCY DEPARTMENT Provider Note   CSN: [id number] Arrival date & time: 11/05/2017  4098     History   Chief Complaint Chief Complaint  Patient presents with  . Respiratory Distress    HPI Jonathan Valencia is a 84 y.o. male.  Level 5 caveat for acuity of condition.  Patient brought in from nursing home by EMS.  Was apparently found in respiratory distress with sats in the 30s and agonal breathing.  Patient unable to give any history.  He is a full code by report.  Patient arrives with agonal breathing and unresponsive.  He is hypotensive, hypothermic and hypoxic. There is food particles in his mouth. Appears very dry.  Nursing home staff told EMS that he was "actively dying for the last 2 days".  The history is provided by the patient, a relative, the EMS personnel and the nursing home. The history is limited by the condition of the patient.    Past Medical History:  Diagnosis Date  . Dementia   . Hypertension     Patient Active Problem List   Diagnosis Date Noted  . Closed left hip fracture (HCC) 05/06/2017  . Pressure injury of skin 12/07/2016  . Acute metabolic encephalopathy 12/07/2016  . Unresponsive episode   . Hypotension 12/06/2016  . Closed intertrochanteric fracture, right, initial encounter (HCC)   . Fracture of greater trochanter of right femur (HCC) 08/07/2016  . Dementia 08/07/2016  . Falls frequently 08/07/2016  . Right hip pain 08/07/2016    Past Surgical History:  Procedure Laterality Date  . HERNIA REPAIR    . INTRAMEDULLARY (IM) NAIL INTERTROCHANTERIC Right 08/09/2016   Procedure: INTERNAL FIXATION RIGHT HIP;  Surgeon: Vickki Hearing, MD;  Location: [facility];  Service: Orthopedics;  Laterality: Right;  . INTRAMEDULLARY (IM) NAIL INTERTROCHANTERIC Left 05/06/2017   Procedure: INTRAMEDULLARY (IM) NAIL INTERTROCHANTRIC;  Surgeon: Bjorn Pippin, MD;  Location: [facility] OR;  Service: Orthopedics;  Laterality: Left;        Home Medications     Prior to Admission medications   Medication Sig Start Date End Date Taking? Authorizing Provider  cholecalciferol (VITAMIN D) 1000 units tablet Take 2,000 Units by mouth daily.     [provider]  divalproex (DEPAKOTE SPRINKLE) 125 MG capsule Take 125 mg by mouth 2 (two) times daily.     [provider]  donepezil (ARICEPT) 10 MG tablet Take 10 mg by mouth daily.    [provider]  enoxaparin (LOVENOX) 40 MG/0.4ML injection Inject 0.4 mLs (40 mg total) into the skin daily. 05/06/17   Cristie Hem, PA-C  feeding supplement (BOOST / RESOURCE BREEZE) LIQD Take 1 Container by mouth 2 (two) times daily between meals. 05/07/17   Calvert Cantor, MD  feeding supplement, ENSURE ENLIVE, (ENSURE ENLIVE) LIQD Take 237 mLs by mouth 3 (three) times daily between meals. 05/07/17   Calvert Cantor, MD  HYDROcodone-acetaminophen (NORCO) 5-325 MG tablet Take 1-2 tablets by mouth every 6 (six) hours as needed for moderate pain. 05/06/17   Cristie Hem, PA-C  Multiple Vitamin (MULTIVITAMIN WITH MINERALS) TABS tablet Take 1 tablet by mouth daily.    [provider]  traZODone (DESYREL) 50 MG tablet Take 50 mg by mouth at bedtime.     [provider]    Family History No family history on file.  Social History Social History   Tobacco Use  . Smoking status: Never Smoker  . Smokeless tobacco: Never Used  Substance Use Topics  .  Alcohol use: No  . Drug use: No     Allergies   Patient has no known allergies.   Review of Systems Review of Systems  Unable to perform ROS: Acuity of condition     Physical Exam Updated Vital Signs BP 127/80   Pulse (!) 138   Temp 99.9 F (37.7 C)   Resp 15   SpO2 99%   Physical Exam  Constitutional: He appears distressed.  Unresponsive, agonal respirations  HENT:  Head: Normocephalic and atraumatic.  Mouth/Throat: No oropharyngeal exudate.  Dry mucous membranes, food in oropharynx  Eyes:  3 mm and  sluggish bilaterally  Neck: Normal range of motion. Neck supple.  Cardiovascular:  Irregular tachycardia in the 120s  Pulmonary/Chest: He has rales.  Abdominal: Soft. There is no tenderness.  Musculoskeletal:  Lower extremity contractures Mottling to lower extremities Intact femoral pulses bilaterally  Neurological: He is alert.  Nonverbal,  Unresponsive, minimal response to pain  Skin: Capillary refill takes more than 3 seconds. There is pallor.     ED Treatments / Results  Labs (all labs ordered are listed, but only abnormal results are displayed) Labs Reviewed  URINALYSIS, ROUTINE W REFLEX MICROSCOPIC - Abnormal; Notable for the following components:      Result Value   Color, Urine AMBER (*)    APPearance CLOUDY (*)    Protein, ur 100 (*)    Bacteria, UA FEW (*)    Squamous Epithelial / LPF 0-5 (*)    All other components within normal limits  I-STAT CG4 LACTIC ACID, ED - Abnormal; Notable for the following components:   Lactic Acid, Venous 10.46 (*)    All other components within normal limits  I-STAT CHEM 8, ED - Abnormal; Notable for the following components:   Sodium 157 (*)    Chloride 118 (*)    BUN 60 (*)    Creatinine, Ser 2.10 (*)    Calcium, Ion 1.12 (*)    All other components within normal limits  CULTURE, BLOOD (ROUTINE X 2)  CULTURE, BLOOD (ROUTINE X 2)  URINE CULTURE  CBC WITH DIFFERENTIAL/PLATELET  COMPREHENSIVE METABOLIC PANEL  PROTIME-INR  TROPONIN I  VALPROIC ACID LEVEL  BLOOD GAS, ARTERIAL  I-STAT TROPONIN, ED  I-STAT CG4 LACTIC ACID, ED    EKG EKG Interpretation  Date/Time:  Tuesday October 17 2017 06:55:35 EDT Ventricular Rate:  139 PR Interval:    QRS Duration: 79 QT Interval:  306 QTC Calculation: 466 R Axis:   -4 Text Interpretation:  Sinus tachycardia Ventricular premature complex Aberrant complex Rate faster Confirmed by Glynn Octave [phone]) on 10/23/2017 7:02:17 AM   Radiology Dg Chest Portable 1 View  Result Date:  11/12/2017 CLINICAL DATA:  Status post endotracheal intubation and central line placement. EXAM: PORTABLE CHEST 1 VIEW COMPARISON:  Chest x-ray of May 05, 2017 FINDINGS: And endotracheal tube is present whose tip projects approximately 1.9 cm above the carina. The lungs are borderline hypoinflated. There are bibasilar interstitial densities. The heart and pulmonary vascularity are normal. There is tortuosity of the ascending and descending thoracic aorta. There is a linear structure extending inferiorly from the base of the neck into the right mid lung which could be a vascular catheter. There is a left subclavian Cordis sheath in place whose tip terminates at the junction of the right and left brachiocephalic veins. IMPRESSION: Reasonable positioning of the Cordis sheath and endotracheal tube. Unspecified tubular structure projects along the course of the right mainstem bronchus and comes to overlie  the right mid lung. According to Dr. Manus Gunningancour on a subsequent x-ray (that he is able to view) there is an esophagogastric tube appropriately positioned within the stomach. The nature of this tubular-linear structure that courses along the trachea and right mainstem bronchus is not clear. Bibasilar atelectasis. No overt CHF nor alveolar pneumonia. No pleural effusion or pneumothorax. These results were called by telephone at the time of interpretation on 11/07/2017 at 7:06 am to Dr. Glynn OctaveSTEPHEN Jaeson Molstad , who verbally acknowledged these results. Electronically Signed   By: David  [place] M.D.   On: Jul 03, 2018 07:14   Dg Chest Port 1v Same Day  Result Date: 11/08/2017 CLINICAL DATA:  Nasogastric placement. EXAM: PORTABLE CHEST 1 VIEW COMPARISON:  Earlier same day. FINDINGS: Nasogastric tube enters the stomach with its tip in the fundus. Gas pattern is unremarkable. Endotracheal tube appears to be at the carina, directed towards the right mainstem bronchus. IMPRESSION: Nasogastric tube tip in the gastric fundus. Endotracheal  tube at the carina. Electronically Signed   By: Paulina FusiMark  Shogry M.D.   On: Jul 03, 2018 07:28    Procedures .Central Line Date/Time: 11/10/2017 6:56 AM Performed by: Glynn Octaveancour, Carlise Stofer, MD Authorized by: Glynn Octaveancour, Lennyn Bellanca, MD   Consent:    Consent obtained:  Emergent situation Pre-procedure details:    Hand hygiene: Hand hygiene performed prior to insertion     Skin preparation:  ChloraPrep   Skin preparation agent: Skin preparation agent completely dried prior to procedure   Anesthesia (see MAR for exact dosages):    Anesthesia method:  None Procedure details:    Location:  L subclavian   Patient position:  Trendelenburg   Procedural supplies:  Single lumen   Catheter size:  8 Fr   Landmarks identified: yes     Ultrasound guidance: no     Number of attempts:  2   Successful placement: yes   Post-procedure details:    Post-procedure:  Dressing applied and line sutured   Assessment:  Blood return through all ports   Patient tolerance of procedure:  Tolerated well, no immediate complications Procedure Name: Intubation Date/Time: 11/02/2017 7:00 AM Performed by: Glynn Octaveancour, Jacquise Rarick, MD Pre-anesthesia Checklist: Patient identified, Emergency Drugs available, Timeout performed, Patient being monitored and Suction available Oxygen Delivery Method: Ambu bag Preoxygenation: Pre-oxygenation with 100% oxygen Induction Type: Rapid sequence and IV induction Ventilation: Mask ventilation without difficulty Laryngoscope Size: Glidescope and 3 Grade View: Grade I Tube type: Subglottic suction tube Tube size: 7.5 mm Number of attempts: 1 Airway Equipment and Method: Video-laryngoscopy and Rigid stylet Placement Confirmation: ETT inserted through vocal cords under direct vision,  Breath sounds checked- equal and bilateral and Positive ETCO2 Secured at: 22 cm Tube secured with: ETT holder Dental Injury: Teeth and Oropharynx as per pre-operative assessment  Difficulty Due To: Difficulty was  unanticipated Future Recommendations: Recommend- induction with short-acting agent, and alternative techniques readily available Comments: Food particles in oropharynx and larynx      (including critical care time)  Medications Ordered in ED Medications  sodium chloride 0.9 % bolus 1,000 mL (has no administration in time range)  sodium chloride 0.9 % bolus 1,000 mL (has no administration in time range)  sodium chloride 0.9 % bolus 1,000 mL (has no administration in time range)    And  sodium chloride 0.9 % bolus 500 mL (has no administration in time range)  piperacillin-tazobactam (ZOSYN) IVPB 3.375 g (has no administration in time range)  vancomycin (VANCOCIN) IVPB 1000 mg/200 mL premix (has no administration in time range)  etomidate (AMIDATE) injection 20 mg (has no administration in time range)  rocuronium (ZEMURON) injection 100 mg (has no administration in time range)  norepinephrine (LEVOPHED) 4 mg in dextrose 5 % 250 mL (0.016 mg/mL) infusion (5 mcg/min Intravenous New Bag/Given 11/03/2017 0640)  EPINEPHrine (ADRENALIN) 0.5 mg (has no administration in time range)     Initial Impression / Assessment and Plan / ED Course  I have reviewed the triage vital signs and the nursing notes.  Pertinent labs & imaging results that were available during my care of the patient were reviewed by me and considered in my medical decision making (see chart for details).    Patient presents from nursing home in extremis.  He is hypotensive, cyanotic, hypoxic and hypothermic.  He is minimally responsive.  D/w Daughter Jonathan Valencia immediately on patient arrival. she is informed of gravity of situation and confirms patient is a full code.  She is on her way to the hospital but would like everything done in the meantime.  She agrees agreeable to putting patient on life support.  Emergent subclavian line placed for vascular access. This was not sterile.  Patient remains unresponsive and unable to get a  blood pressure.  RSI performed after vascular access obtained.  Patient started on sepsis protocol with IV fluids and antibiotics.  Suspect aspiration pneumonia.  Levophed added for hypotension in the 60s.  Patient's daughter Jonathan Valencia at bedside.  She agrees with current level of care.  Does not want CPR if patient's heart were to stop.  Does not want any further escalation of care.  Does not want patient to be [facility].  Labs consistent with dehydration, lactic acidosis, AK I, hypernatremia CXR abnormality d/w Dr. [place] of radiology.  Endotracheal tube, left subclavian catheter, and NG tube in appropriate position.  No pneumothorax. Unclear linear structure in R midlung. Does not appear to be CVC or wire.   Admission d/w Dr. Arbutus Leas. CT chest pending to evaluate R lung abnormality on CXR. Daughter updated at bedside.  CRITICAL CARE Performed by: Glynn Octave Total critical care time: 60 minutes Critical care time was exclusive of separately billable procedures and treating other patients. Critical care was necessary to treat or prevent imminent or life-threatening deterioration. Critical care was time spent personally by me on the following activities: development of treatment plan with patient and/or surrogate as well as nursing, discussions with consultants, evaluation of patient's response to treatment, examination of patient, obtaining history from patient or surrogate, ordering and performing treatments and interventions, ordering and review of laboratory studies, ordering and review of radiographic studies, pulse oximetry and re-evaluation of patient's condition.  Final Clinical Impressions(s) / ED Diagnoses   Final diagnoses:  Acute respiratory failure with hypoxia (HCC)  Hypernatremia  AKI (acute kidney injury) (HCC)  Aspiration pneumonia due to vomit, unspecified laterality, unspecified part of lung [facility])    ED Discharge Orders    None       Glynn Octave, MD 10/21/2017 [phone]

## 2017-10-17 NOTE — ED Notes (Signed)
Dr Tat in to speak with Daughter.

## 2017-10-17 NOTE — ED Notes (Signed)
All I-stats and port X-rays completed following ETT and NGT placements.  Dr Manus Gunningancour gave verbal order to use central line after verifying placements on both.

## 2017-10-17 NOTE — Progress Notes (Signed)
While I was making rounds in the intensive care unit staff there informed me of the fact that Mr. Jonathan Valencia was in the emergency department on the ventilator.  The presumption was that consultation would be needed but after I came down to the emergency department and examined him his daughter came in and said that she was going to have terminal weaning later today after family arrived so I will plan not to be actively involved in his care.  I did ask that his endotracheal tube be withdrawn 2 cm

## 2017-10-17 NOTE — Progress Notes (Signed)
[id number] Patient's daughter just reported that she and her family are now ready to start the terminal wean/extubation process. RT & MD notified. Levophed gtt stopped, Fentanyl gtt continued for comfort.

## 2017-10-17 NOTE — ED Notes (Signed)
CRITICAL VALUE ALERT  Critical Value:  PH 7.19, pCo2, 45.5, PO2-230, Sats 98.8, Bicarb 16  Date & Time Notied:  2018-04-11 @ 0846  Provider Notified: Shanda BumpsJessica, ICU/RN to relay to MD  Orders Received/Actions taken: see new orders

## 2017-10-17 NOTE — Progress Notes (Signed)
Per Dr.Tat once current bag of Fentanyl gtt is completed, d/c order for Fentanyl, and use new order for Morphine 2mg  Q1H IV push for pain.

## 2017-10-17 NOTE — Progress Notes (Signed)
Patient extubated @ 1400 per daughter's request. Patient tolerated well, O2 via [place] @ 2L applied for comfort. Patient continues on Fentanyl gtt at this time for comfort as well. Family at bedside. O2 SAT 87% 2L

## 2017-10-17 NOTE — ED Notes (Signed)
Warm blankets placed on pt. 

## 2017-10-17 NOTE — ED Notes (Signed)
Pts dentures given to daughter

## 2017-10-17 NOTE — ED Notes (Signed)
Family has decided to not increase course of treatment.  Dr Manus Gunningancour  To place order for limited code status.

## 2017-10-17 NOTE — ED Notes (Signed)
CRITICAL VALUE ALERT  Critical Value:  Troponin 0.08  Date & Time Notied:  02-16-2018 @ 0802  Provider Notified: Dr Tat (at bedside)  Orders Received/Actions taken: see new orders

## 2017-10-18 DIAGNOSIS — Z515 Encounter for palliative care: Secondary | ICD-10-CM

## 2017-10-18 DIAGNOSIS — R6521 Severe sepsis with septic shock: Secondary | ICD-10-CM

## 2017-10-18 MED ORDER — MORPHINE 100MG IN NS 100ML (1MG/ML) PREMIX INFUSION
0.5000 mg/h | INTRAVENOUS | Status: DC
  Administered 2017-10-18: 0.5 mg/h via INTRAVENOUS
  Filled 2017-10-18: qty 100

## 2017-10-18 MED ORDER — POLYVINYL ALCOHOL 1.4 % OP SOLN
1.0000 [drp] | Freq: Four times a day (QID) | OPHTHALMIC | Status: DC | PRN
  Filled 2017-10-18: qty 15

## 2017-10-18 MED ORDER — GLYCOPYRROLATE 0.2 MG/ML IJ SOLN
0.2000 mg | INTRAMUSCULAR | Status: DC | PRN
  Filled 2017-10-18: qty 1

## 2017-10-18 MED ORDER — SODIUM CHLORIDE 0.9% FLUSH
3.0000 mL | Freq: Two times a day (BID) | INTRAVENOUS | Status: DC
  Administered 2017-10-18: 3 mL via INTRAVENOUS

## 2017-10-18 MED ORDER — LORAZEPAM 1 MG PO TABS: 1.0000 mg | ORAL_TABLET | ORAL | Status: DC | PRN

## 2017-10-18 MED ORDER — SODIUM CHLORIDE 0.9 % IV SOLN: 250.0000 mL | INTRAVENOUS | Status: DC | PRN

## 2017-10-18 MED ORDER — SODIUM CHLORIDE 0.9% FLUSH: 3.0000 mL | INTRAVENOUS | Status: DC | PRN

## 2017-10-18 MED ORDER — MORPHINE BOLUS VIA INFUSION
1.0000 mg | INTRAVENOUS | Status: DC | PRN
  Filled 2017-10-18: qty 1

## 2017-10-18 MED ORDER — GLYCOPYRROLATE 1 MG PO TABS
1.0000 mg | ORAL_TABLET | ORAL | Status: DC | PRN
  Filled 2017-10-18: qty 1

## 2017-10-18 MED ORDER — BIOTENE DRY MOUTH MT LIQD: 15.0000 mL | OROMUCOSAL | Status: DC | PRN

## 2017-10-18 MED ORDER — LORAZEPAM 2 MG/ML IJ SOLN
1.0000 mg | INTRAMUSCULAR | Status: DC | PRN
  Filled 2017-10-18: qty 1

## 2017-10-18 MED FILL — Medication: Qty: 1 | Status: AC

## 2017-10-20 LAB — URINE CULTURE

## 2017-10-22 LAB — CULTURE, BLOOD (ROUTINE X 2)
CULTURE: NO GROWTH
CULTURE: NO GROWTH
Special Requests: ADEQUATE

## 2017-11-10 IMAGING — MR MR HEAD W/O CM
6 of 10 series · 30 of 48 positions shown · non-contrast
Comparison: CT HEAD December 06, 2016

CLINICAL DATA: Unresponsive.  History of dementia and hypertensive.

EXAM:
MRI HEAD WITHOUT CONTRAST
TECHNIQUE: Multiplanar, multiecho pulse sequences of the brain and surrounding
structures were obtained without intravenous contrast.

[Series 3: DWI · axial · 3.0mm · 0.77mm/px · z∈[-78,+84]mm · 6 of 55 slices shown (1 of 4)]
[im 1/55]
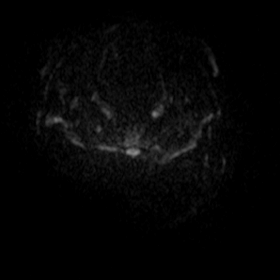
[im 11/55]
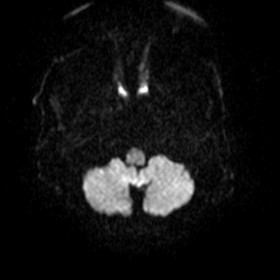
[im 22/55]
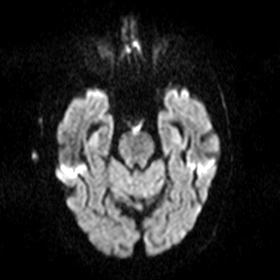
[im 33/55]
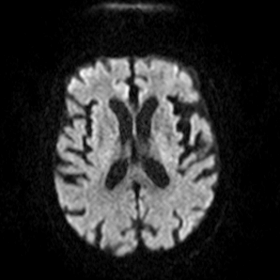
[im 44/55]
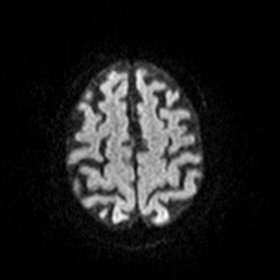
[im 55/55]
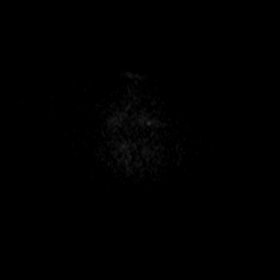

[Series 4: DWI · axial · 3.0mm · 0.77mm/px · z∈[-78,+84]mm · 7 of 55 slices shown (2 of 4)]
[im 1/55]
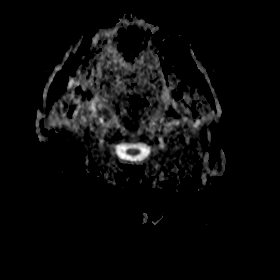
[im 10/55]
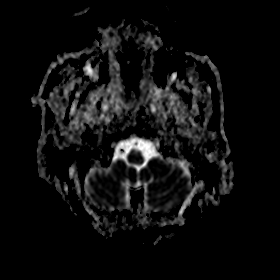
[im 19/55]
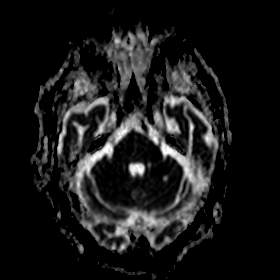
[im 28/55]
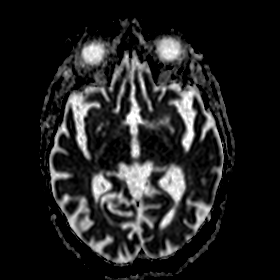
[im 37/55]
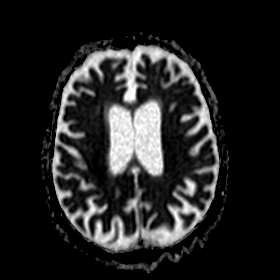
[im 46/55]
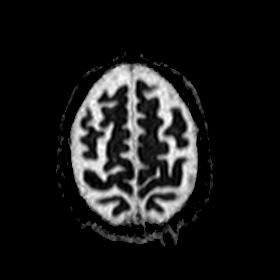
[im 55/55]
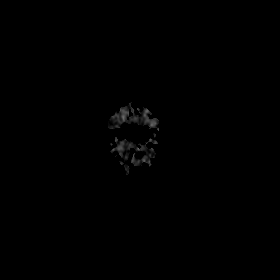

[Series 5: DWI · coronal · 5.0mm · 0.49mm/px · 4 of 36 slices shown (3 of 4)]
[im 1/36]
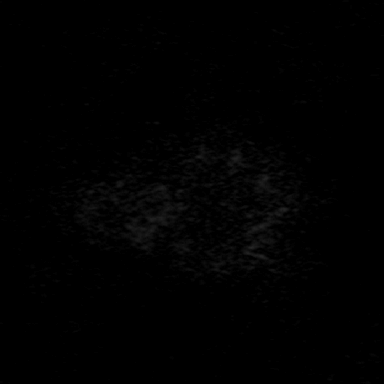
[im 12/36]
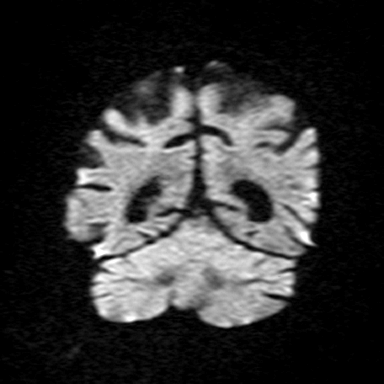
[im 24/36]
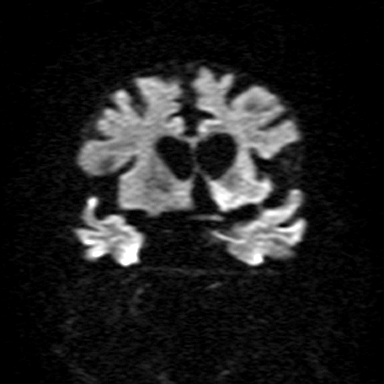
[im 36/36]
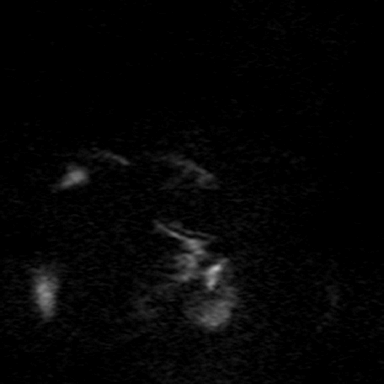

[Series 6: DWI · coronal · 5.0mm · 0.49mm/px · 4 of 36 slices shown (4 of 4)]
[im 1/36]
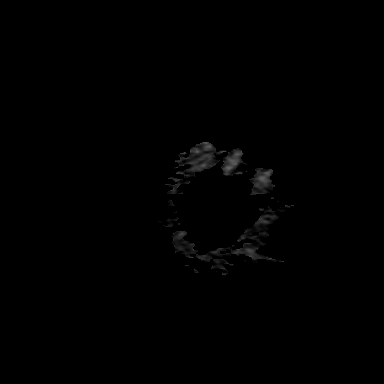
[im 12/36]
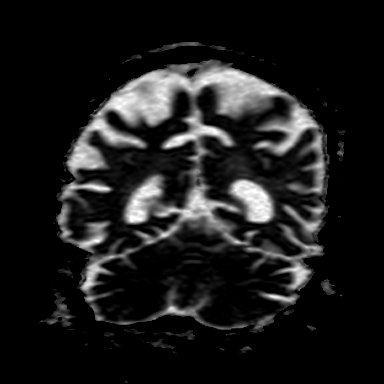
[im 24/36]
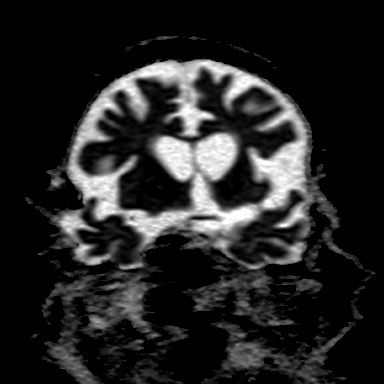
[im 36/36]
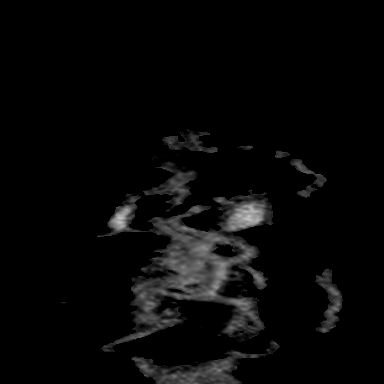

[Series 8: T2 · axial · 5.0mm · 0.49mm/px · z∈[-68,+75]mm · 3 of 23 slices shown]
[im 1/23]
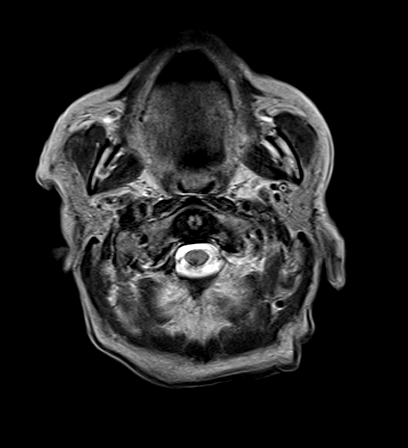
[im 12/23]
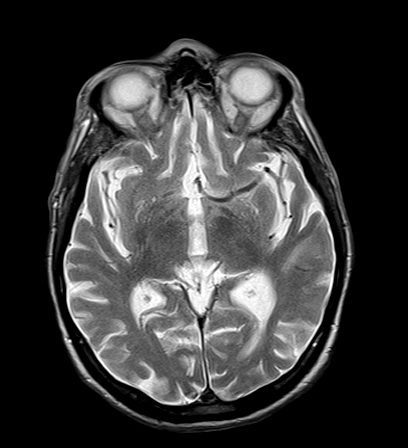
[im 23/23]
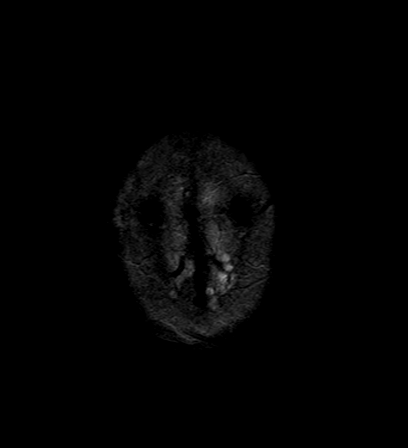

[Series 9: FLAIR · axial · 3.0mm · 0.34mm/px · z∈[-65,+72]mm · 6 of 47 slices shown]
[im 1/47]
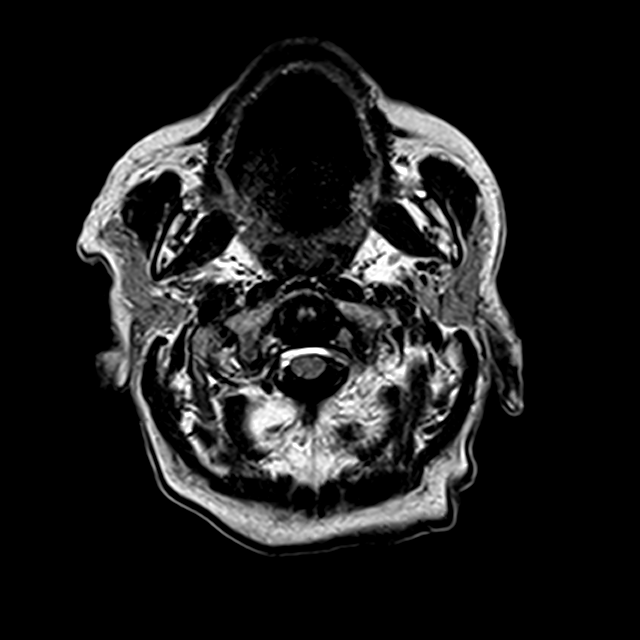
[im 10/47]
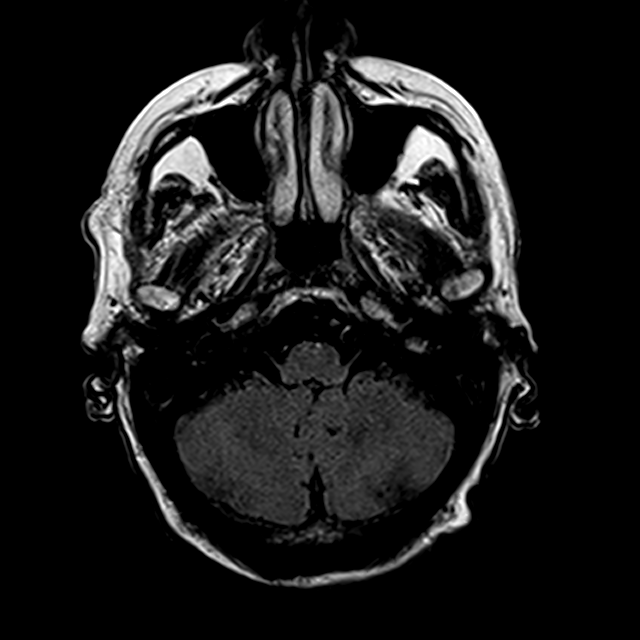
[im 19/47]
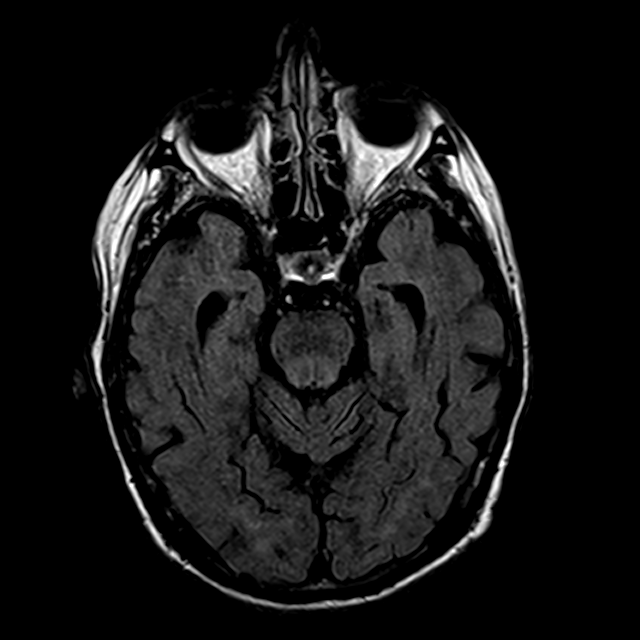
[im 28/47]
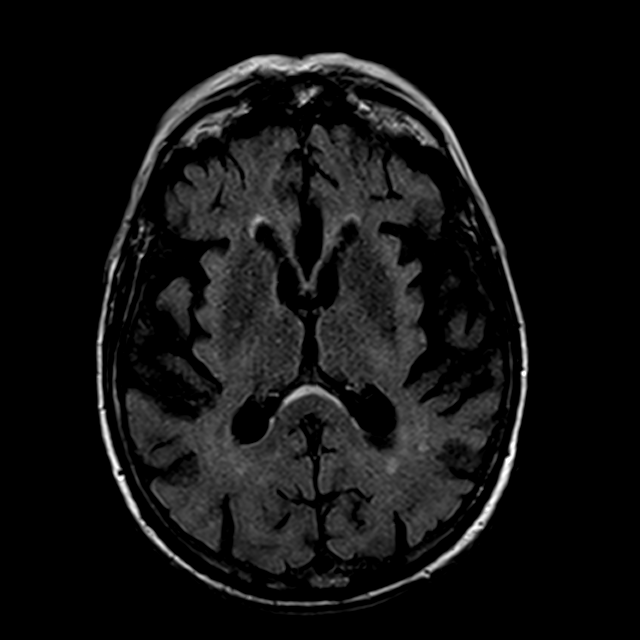
[im 37/47]
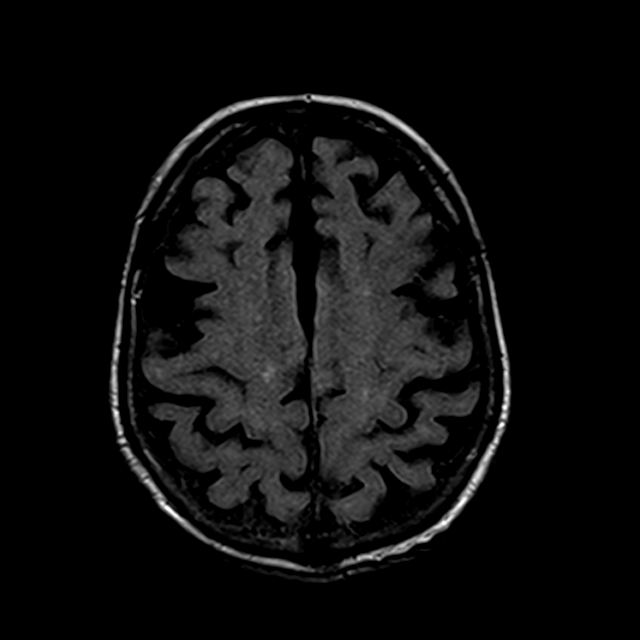
[im 47/47]
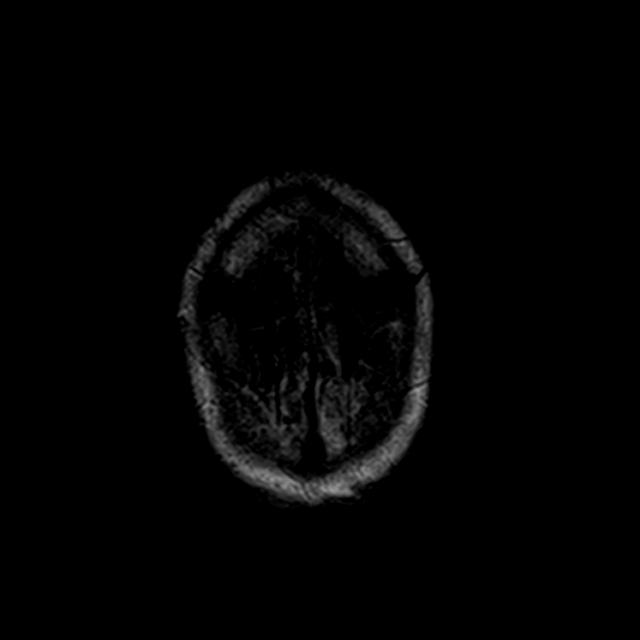

[30 of 48 positions shown; findings below may reference images not displayed]

FINDINGS: BRAIN: No reduced diffusion to suggest acute ischemia. No
susceptibility artifact to suggest hemorrhage. The ventricles and
sulci are normal for patient's age. Patchy supratentorial white
matter FLAIR T2 hyperintensities compatible with mild to moderate
chronic small vessel ischemic disease, less than expected for age.
Old small LEFT cerebellar artifact. No suspicious parenchymal
signal, masses or mass effect. No abnormal extra-axial fluid
collections.

VASCULAR: Major intracranial vascular flow voids present at skull
base, dolichoectasia associated with chronic hypertension.

SKULL AND UPPER CERVICAL SPINE: No abnormal sellar expansion. No
suspicious calvarial bone marrow signal. Craniocervical junction
maintained.

SINUSES/ORBITS: Trace maxillary sinus mucosal thickening. Mastoid
air cells are well aerated. The included ocular globes and orbital
contents are non-suspicious. Status post bilateral ocular lens
implants.

OTHER: Patient is edentulous.
IMPRESSION: No acute intracranial process.

Old small LEFT cerebellar infarct and mild to moderate chronic small
vessel ischemic disease.

## 2017-11-15 NOTE — Progress Notes (Signed)
[facility] PROGRESS NOTE  Jonathan Valencia ZOX:[id number] DOB: June 17, 1927 DOA: 11/14/2017 PCP: System, Provider Not In  Interim summary and HPI 84 y.o. male with medical history of dementia, hypertension presenting with altered mental status and respiratory distress.  The patient is unable to provide any history secondary to his extremis.  The patient's daughters at the bedside to supplement the history.  She states that approximately 10 days prior to admission, she noted the patient to have decreased oral intake, lethargy, and shortness of breath.  The patient's symptoms continue to progress over the next week with increasing lethargy and little to no oral intake.  Apparently blood work has been obtained at his nursing facility and a chest x-ray was also obtained.  Per the patient's daughter, there was no pneumonia and the patient had a slightly elevated WBC.  It was initially felt that the patient had a progression of his underlying dementia.  At baseline, the patient does not walk.  He is wheelchair-bound.  He is able to say a few words but unable to speak in complete sentences at baseline.  Found with bibasilar opacities on CXR here, AKI, hypernatremia, and features suggesting severe sepsis.   Assessment/Plan: 1-severe sepsis: with septic shock in the setting of aspiration PNA /HCAP -patient lactic acid up 10.46 on presentation  -condition continue to deteriorate and despite intubation and pressors, was expected poor quality and guarded outcome. -family decided upon terminal weaning/extubation and focus on comfort care. -antibiotics and IVF's discontinued -patient started on morphine drip and PRN ativan and robinul   2-AKI and hypernatremia: appears to be secondary to sepsis and pre-renal  -IVF's given initially -now symptomatic management and comfort care -no further labs planned   3-advance dementia  -continue supportive care   4-severe protein calorie malnutrition  -currently  NPO and unable to eat -plan is for full comfort care  Code Status: DNR/DNI Family Communication: daughter at bedside  Disposition Plan: expected hospital death. Terminal weaning done on 11/10/2017   Consultants:  None  Procedures:  See below for x-ray reports  Intubation and ventilatory support 4/2>>4/2  Antibiotics:  vanc and zosyn 4/2>>4/2  HPI/Subjective: Obtunded, with active tachypnea and resp distress.   Objective: Vitals:   26-Oct-2017 0830 10/26/2017 0835  BP:    Pulse: (!) 47 (!) 121  Resp: (!) 41 (!) 42  Temp:    SpO2: 90% 91%    Intake/Output Summary (Last 24 hours) at 10/26/2017 0905 Last data filed at 10/27/2017 1700 Gross per 24 hour  Intake -  Output 650 ml  Net -650 ml   There were no vitals filed for this visit.  Exam:   General:  Obtunded, no responsive to specific questions; with rapid agonal breathing and looking uncomfortable. Afebrile.   Cardiovascular: tachycardic, no rubs, no gallops, no JVD  Respiratory: positive tachypnea, mild expiratory wheezing and scattered rhonchi.  Abdomen: soft, NT, positive BS  Musculoskeletal: no edema, no cyanosis   Data Reviewed: Basic Metabolic Panel: Recent Labs  Lab 11/07/2017 0626 10/16/2017 0628  NA 158* 157*  K 4.4 4.5  CL 110 118*  CO2 18*  --   GLUCOSE 85 91  BUN 64* 60*  CREATININE 1.98* 2.10*  CALCIUM 9.1  --    Liver Function Tests: Recent Labs  Lab 11/04/2017 0626  AST 176*  ALT 108*  ALKPHOS 73  BILITOT 2.9*  PROT 7.2  ALBUMIN 2.3*   CBC: Recent Labs  Lab 10/21/2017 0626 11/05/2017 0628  WBC 14.7*  --  NEUTROABS 12.3*  --   HGB 15.2 16.3  HCT 49.8 48.0  MCV 92.6  --   PLT 411*  --    Cardiac Enzymes: Recent Labs  Lab 10/16/2017 0626  TROPONINI 0.08*    Recent Results (from the past 240 hour(s))  Blood culture (routine x 2)     Status: None (Preliminary result)   Collection Time: 11/07/2017  7:01 AM  Result Value Ref Range Status   Specimen Description BLOOD LEFT ARM   Final   Special Requests   Final    BOTTLES DRAWN AEROBIC AND ANAEROBIC Blood Culture adequate volume   Culture   Final    NO GROWTH 1 DAY Performed at [facility], [address] [place], [place] [address]    Report Status PENDING  Incomplete  Blood culture (routine x 2)     Status: None (Preliminary result)   Collection Time: 10/25/2017  7:16 AM  Result Value Ref Range Status   Specimen Description BLOOD LEFT HAND  Final   Special Requests   Final    BOTTLES DRAWN AEROBIC ONLY Blood Culture results may not be optimal due to an inadequate volume of blood received in culture bottles   Culture   Final    NO GROWTH 1 DAY Performed at [facility], [address] [place], [place] [address]    Report Status PENDING  Incomplete  Urine culture     Status: Abnormal (Preliminary result)   Collection Time: 10/24/2017  7:18 AM  Result Value Ref Range Status   Specimen Description   Final    URINE, CLEAN CATCH Performed at [facility], [address] [place], [place] [address]    Special Requests   Final    URINE, CLEAN CATCH Performed at [facility], [address] [place], [place] [address]    Culture (A)  Final    30,000 COLONIES/mL GRAM NEGATIVE RODS IDENTIFICATION AND SUSCEPTIBILITIES TO FOLLOW Performed at [facility], [address] [place], [place] [address]    Report Status PENDING  Incomplete  MRSA PCR Screening     Status: None   Collection Time: 10/16/2017  9:08 PM  Result Value Ref Range Status   MRSA by PCR NEGATIVE NEGATIVE Final    Comment:        The GeneXpert MRSA Assay (FDA approved for NASAL specimens only), is one component of a comprehensive MRSA colonization surveillance program. It is not intended to diagnose MRSA infection nor to guide or monitor treatment for MRSA infections. Performed at [facility], [address] [place], [place] [address]      Studies: Dg Chest Portable 1 View  Result Date: 11/04/2017 CLINICAL DATA:  Status post  endotracheal intubation and central line placement. EXAM: PORTABLE CHEST 1 VIEW COMPARISON:  Chest x-ray of May 05, 2017 FINDINGS: And endotracheal tube is present whose tip projects approximately 1.9 cm above the carina. The lungs are borderline hypoinflated. There are bibasilar interstitial densities. The heart and pulmonary vascularity are normal. There is tortuosity of the ascending and descending thoracic aorta. There is a linear structure extending inferiorly from the base of the neck into the right mid lung which could be a vascular catheter. There is a left subclavian Cordis sheath in place whose tip terminates at the junction of the right and left brachiocephalic veins. IMPRESSION: Reasonable positioning of the Cordis sheath and endotracheal tube. Unspecified tubular structure projects along the course of the right mainstem bronchus and comes to overlie the right  mid lung. According to Dr. Manus Gunning on a subsequent x-ray (that he is able to view) there is an esophagogastric tube appropriately positioned within the stomach. The nature of this tubular-linear structure that courses along the trachea and right mainstem bronchus is not clear. Bibasilar atelectasis. No overt CHF nor alveolar pneumonia. No pleural effusion or pneumothorax. These results were called by telephone at the time of interpretation on 10/19/2017 at 7:06 am to Dr. Glynn Octave , who verbally acknowledged these results. Electronically Signed   By: David  [place] M.D.   On: Oct 19, 2017 07:14   Dg Chest Port 1v Same Day  Result Date: 2017/10/19 CLINICAL DATA:  Nasogastric placement. EXAM: PORTABLE CHEST 1 VIEW COMPARISON:  Earlier same day. FINDINGS: Nasogastric tube enters the stomach with its tip in the fundus. Gas pattern is unremarkable. Endotracheal tube appears to be at the carina, directed towards the right mainstem bronchus. IMPRESSION: Nasogastric tube tip in the gastric fundus. Endotracheal tube at the carina. Electronically  Signed   By: Paulina Fusi M.D.   On: 10/19/2017 07:28    Scheduled Meds: . sodium chloride flush  3 mL Intravenous Q12H   Continuous Infusions: . sodium chloride    . morphine      Time spent: 40 minutes (> 50% of the time dedicated to face to face examination; discussion in details with patient's family member (daughter) at bedside, about prognosis, available treatment, expectation and appropriate support to be given. It was confirmed decision of DNR/DNI and transition of care to full comfort and symptomatic management).    Vassie Loll  [facility] Pager [phone]. If 7PM-7AM, please contact night-coverage at [email or web address], password [facility] 11/06/2017, 9:05 AM  LOS: 1 day

## 2017-11-15 NOTE — Progress Notes (Signed)
Patient expired @ 1045 today, verified by this Clinical research associatewriter and Hortense RamalJessie Motley, RN. Daughter at bedside, MD notified. [facility] notified.

## 2017-11-15 NOTE — Progress Notes (Signed)
1630 Patient [facility] [place]. Daughter Ronnald Collum([name] Ore) at bedside and patient's watch, clothing, and dentures collected and taken by daughter. Patient's certificate of death and transit paperwork given to funeral home staff. Post mortem care and removal of all lines completed prior to the funeral home's arrival.

## 2017-11-15 NOTE — Discharge Summary (Signed)
Death Summary  Jonathan HugerLawrence Valencia ZOX:[id number] DOB: Jan 02, 1927 DOA: 11/11/2017  PCP: System, Provider Not In PCP/Office notified: No PCP listed in our system. (patient came from SNF)  Admit date: 10/28/2017 Date of Death: 11/08/2017  Final Diagnoses:  Active Problems:   Sepsis due to undetermined organism (HCC) Acute kidney injury Hyponatremia Advanced dementia Severe protein calorie malnutrition  History of present illness:  84 y.o.malewith medical history ofdementia, hypertension presenting with altered mental status and respiratory distress. The patient is unable to provide any history secondary to his extremis. The patient's daughters at the bedside to supplement the history. She states that approximately 10 daysprior to admission, she noted the patient to have decreased oral intake, lethargy, and shortness of breath. The patient's symptoms continue to progress over the next week with increasing lethargy and little to no oral intake. Apparently blood work has been obtained at his nursing facility and a chest x-ray was also obtained. Per the patient's daughter, there was no pneumonia and the patient had a slightly elevated WBC. It was initially felt that the patient had a progression of his underlying dementia. At baseline, the patient does not walk. He is wheelchair-bound. He is able to say a few words but unable to speak in complete sentences at baseline.  Found with bibasilar opacities on CXR here, AKI, hypernatremia, and features suggesting severe sepsis.   Hospital Course:  1-severe sepsis: with septic shock in the setting of aspiration PNA /HCAP -patient lactic acid up 10.46 on presentation; down to 5 with IVF's  -condition overall continued deteriorating despite intubation and pressors, was expected poor quality and guarded outcome. -family decided upon terminal weaning/extubation and focus on comfort care and symptomatic management. -antibiotics and IVF's were  discontinue -patient started on morphine drip, PRN ativan and robinul  -he expired at 10:45 am  2-AKI and hypernatremia: appears to be secondary to sepsis and pre-renal  -IVF's given initially -after decision for comfort care made; his IVF's transition to saline lock  -no further blood work done -patient expired at 10:45 am while receiving symptomatic management only   3-advance dementia  -plan was to continue supportive care and focus on symptoms management  -according to daughter patient with poor quality of life prior to admission.  4-severe protein calorie malnutrition  -patient was NPO and unable to eat with obtunded state from acute infection. -plan decided for full comfort care -he expired at 10:45 am   Time: 25 minutes  Signed:  Vassie LollCarlos Deryn Valencia  [facility] 10/21/2017, 12:15 PM

## 2017-11-15 DEATH — deceased
